# Patient Record
Sex: Female | Born: 1935 | Race: White | Hispanic: No | Smoking: Former smoker
Health system: Southern US, Community
[De-identification: ages and names within clinical notes are randomized; demographics above are authoritative.]

## PROBLEM LIST (undated history)

## (undated) DIAGNOSIS — R413 Other amnesia: Secondary | ICD-10-CM

## (undated) DIAGNOSIS — E049 Nontoxic goiter, unspecified: Secondary | ICD-10-CM

## (undated) DIAGNOSIS — I10 Essential (primary) hypertension: Secondary | ICD-10-CM

## (undated) DIAGNOSIS — R42 Dizziness and giddiness: Secondary | ICD-10-CM

## (undated) DIAGNOSIS — E785 Hyperlipidemia, unspecified: Secondary | ICD-10-CM

## (undated) DIAGNOSIS — M159 Polyosteoarthritis, unspecified: Secondary | ICD-10-CM

## (undated) DIAGNOSIS — C4490 Unspecified malignant neoplasm of skin, unspecified: Secondary | ICD-10-CM

## (undated) DIAGNOSIS — I639 Cerebral infarction, unspecified: Secondary | ICD-10-CM

## (undated) HISTORY — PX: BREAST SURGERY: SHX581

## (undated) HISTORY — DX: Polyosteoarthritis, unspecified: M15.9

## (undated) HISTORY — PX: KNEE SURGERY: SHX244

## (undated) HISTORY — DX: Cerebral infarction, unspecified: I63.9

## (undated) HISTORY — DX: Other amnesia: R41.3

## (undated) HISTORY — DX: Nontoxic goiter, unspecified: E04.9

## (undated) HISTORY — DX: Dizziness and giddiness: R42

## (undated) HISTORY — DX: Essential (primary) hypertension: I10

## (undated) HISTORY — DX: Hyperlipidemia, unspecified: E78.5

## (undated) HISTORY — DX: Unspecified malignant neoplasm of skin, unspecified: C44.90

---

## 1997-07-19 ENCOUNTER — Encounter: Admission: RE | Admit: 1997-07-19 | Discharge: 1997-10-17 | Payer: Self-pay | Admitting: Orthopedic Surgery

## 1999-08-01 ENCOUNTER — Encounter: Admission: RE | Admit: 1999-08-01 | Discharge: 1999-08-01 | Payer: Self-pay | Admitting: Family Medicine

## 1999-08-01 ENCOUNTER — Encounter: Payer: Self-pay | Admitting: Family Medicine

## 2000-08-16 ENCOUNTER — Ambulatory Visit (HOSPITAL_COMMUNITY): Admission: RE | Admit: 2000-08-16 | Discharge: 2000-08-16 | Payer: Self-pay | Admitting: Obstetrics and Gynecology

## 2000-08-16 ENCOUNTER — Encounter: Payer: Self-pay | Admitting: Obstetrics and Gynecology

## 2001-08-17 ENCOUNTER — Encounter: Payer: Self-pay | Admitting: Obstetrics and Gynecology

## 2001-08-17 ENCOUNTER — Ambulatory Visit (HOSPITAL_COMMUNITY): Admission: RE | Admit: 2001-08-17 | Discharge: 2001-08-17 | Payer: Self-pay | Admitting: Obstetrics and Gynecology

## 2002-08-24 ENCOUNTER — Ambulatory Visit (HOSPITAL_COMMUNITY): Admission: RE | Admit: 2002-08-24 | Discharge: 2002-08-24 | Payer: Self-pay | Admitting: Obstetrics and Gynecology

## 2002-08-24 ENCOUNTER — Encounter: Payer: Self-pay | Admitting: Obstetrics and Gynecology

## 2003-09-06 ENCOUNTER — Ambulatory Visit (HOSPITAL_COMMUNITY): Admission: RE | Admit: 2003-09-06 | Discharge: 2003-09-06 | Payer: Self-pay | Admitting: Family Medicine

## 2003-11-02 ENCOUNTER — Ambulatory Visit (HOSPITAL_COMMUNITY): Admission: RE | Admit: 2003-11-02 | Discharge: 2003-11-02 | Payer: Self-pay | Admitting: Orthopedic Surgery

## 2004-09-10 ENCOUNTER — Ambulatory Visit (HOSPITAL_COMMUNITY): Admission: RE | Admit: 2004-09-10 | Discharge: 2004-09-10 | Payer: Self-pay | Admitting: Family Medicine

## 2005-09-11 ENCOUNTER — Ambulatory Visit (HOSPITAL_COMMUNITY): Admission: RE | Admit: 2005-09-11 | Discharge: 2005-09-11 | Payer: Self-pay | Admitting: Family Medicine

## 2006-09-14 ENCOUNTER — Ambulatory Visit (HOSPITAL_COMMUNITY): Admission: RE | Admit: 2006-09-14 | Discharge: 2006-09-14 | Payer: Self-pay | Admitting: Family Medicine

## 2007-09-16 ENCOUNTER — Ambulatory Visit (HOSPITAL_COMMUNITY): Admission: RE | Admit: 2007-09-16 | Discharge: 2007-09-16 | Payer: Self-pay | Admitting: Internal Medicine

## 2008-09-17 ENCOUNTER — Ambulatory Visit (HOSPITAL_COMMUNITY): Admission: RE | Admit: 2008-09-17 | Discharge: 2008-09-17 | Payer: Self-pay | Admitting: Internal Medicine

## 2008-11-27 ENCOUNTER — Encounter: Admission: RE | Admit: 2008-11-27 | Discharge: 2008-11-27 | Payer: Self-pay | Admitting: Internal Medicine

## 2009-02-26 ENCOUNTER — Emergency Department (HOSPITAL_COMMUNITY): Admission: EM | Admit: 2009-02-26 | Discharge: 2009-02-26 | Payer: Self-pay | Admitting: Emergency Medicine

## 2009-03-01 ENCOUNTER — Emergency Department (HOSPITAL_COMMUNITY): Admission: EM | Admit: 2009-03-01 | Discharge: 2009-03-01 | Payer: Self-pay | Admitting: Emergency Medicine

## 2009-03-05 ENCOUNTER — Emergency Department (HOSPITAL_COMMUNITY): Admission: EM | Admit: 2009-03-05 | Discharge: 2009-03-05 | Payer: Self-pay | Admitting: Emergency Medicine

## 2009-03-12 ENCOUNTER — Emergency Department (HOSPITAL_COMMUNITY): Admission: EM | Admit: 2009-03-12 | Discharge: 2009-03-12 | Payer: Self-pay | Admitting: Family Medicine

## 2009-03-21 ENCOUNTER — Encounter: Admission: RE | Admit: 2009-03-21 | Discharge: 2009-04-24 | Payer: Self-pay | Admitting: Internal Medicine

## 2009-09-18 ENCOUNTER — Ambulatory Visit (HOSPITAL_COMMUNITY): Admission: RE | Admit: 2009-09-18 | Discharge: 2009-09-18 | Payer: Self-pay | Admitting: Internal Medicine

## 2010-03-25 ENCOUNTER — Ambulatory Visit: Payer: Self-pay | Admitting: Internal Medicine

## 2010-03-30 ENCOUNTER — Encounter: Payer: Self-pay | Admitting: Internal Medicine

## 2010-04-23 ENCOUNTER — Ambulatory Visit: Payer: MEDICARE | Admitting: Internal Medicine

## 2010-04-23 DIAGNOSIS — R413 Other amnesia: Secondary | ICD-10-CM

## 2010-04-23 DIAGNOSIS — E785 Hyperlipidemia, unspecified: Secondary | ICD-10-CM

## 2010-04-23 DIAGNOSIS — F4323 Adjustment disorder with mixed anxiety and depressed mood: Secondary | ICD-10-CM

## 2010-06-08 DIAGNOSIS — E049 Nontoxic goiter, unspecified: Secondary | ICD-10-CM

## 2010-06-08 HISTORY — DX: Nontoxic goiter, unspecified: E04.9

## 2010-06-17 DIAGNOSIS — E785 Hyperlipidemia, unspecified: Secondary | ICD-10-CM

## 2010-06-17 DIAGNOSIS — I10 Essential (primary) hypertension: Secondary | ICD-10-CM

## 2010-06-18 ENCOUNTER — Other Ambulatory Visit: Payer: Self-pay | Admitting: Internal Medicine

## 2010-06-18 ENCOUNTER — Ambulatory Visit: Payer: 59 | Admitting: Internal Medicine

## 2010-06-18 ENCOUNTER — Ambulatory Visit (INDEPENDENT_AMBULATORY_CARE_PROVIDER_SITE_OTHER)
Admission: RE | Admit: 2010-06-18 | Discharge: 2010-06-18 | Disposition: A | Discharge status: Home / Self Care | Payer: Medicare Other | Source: Ambulatory Visit | Attending: Internal Medicine | Admitting: Internal Medicine

## 2010-06-18 ENCOUNTER — Ambulatory Visit (HOSPITAL_BASED_OUTPATIENT_CLINIC_OR_DEPARTMENT_OTHER)
Admission: RE | Admit: 2010-06-18 | Discharge: 2010-06-18 | Disposition: A | Discharge status: Home / Self Care | Payer: Medicare Other | Source: Ambulatory Visit | Attending: Internal Medicine | Admitting: Internal Medicine

## 2010-06-18 DIAGNOSIS — E010 Iodine-deficiency related diffuse (endemic) goiter: Secondary | ICD-10-CM

## 2010-06-18 DIAGNOSIS — E049 Nontoxic goiter, unspecified: Secondary | ICD-10-CM | POA: Insufficient documentation

## 2010-07-10 ENCOUNTER — Ambulatory Visit (INDEPENDENT_AMBULATORY_CARE_PROVIDER_SITE_OTHER): Payer: MEDICARE | Admitting: Internal Medicine

## 2010-07-10 DIAGNOSIS — E049 Nontoxic goiter, unspecified: Secondary | ICD-10-CM

## 2010-07-10 DIAGNOSIS — I10 Essential (primary) hypertension: Secondary | ICD-10-CM

## 2010-07-10 DIAGNOSIS — E785 Hyperlipidemia, unspecified: Secondary | ICD-10-CM

## 2010-07-21 ENCOUNTER — Other Ambulatory Visit: Payer: Self-pay | Admitting: Internal Medicine

## 2010-07-21 ENCOUNTER — Encounter: Payer: Self-pay | Admitting: Internal Medicine

## 2010-07-21 DIAGNOSIS — E041 Nontoxic single thyroid nodule: Secondary | ICD-10-CM

## 2010-07-23 ENCOUNTER — Other Ambulatory Visit: Payer: Self-pay | Admitting: Interventional Radiology

## 2010-07-23 ENCOUNTER — Ambulatory Visit
Admission: RE | Admit: 2010-07-23 | Discharge: 2010-07-23 | Disposition: A | Discharge status: Home / Self Care | Payer: MEDICARE | Source: Ambulatory Visit | Attending: Internal Medicine | Admitting: Internal Medicine

## 2010-07-23 ENCOUNTER — Other Ambulatory Visit (HOSPITAL_COMMUNITY)
Admission: RE | Admit: 2010-07-23 | Discharge: 2010-07-23 | Disposition: A | Discharge status: Home / Self Care | Payer: MEDICARE | Source: Ambulatory Visit | Attending: Interventional Radiology | Admitting: Interventional Radiology

## 2010-07-23 DIAGNOSIS — E041 Nontoxic single thyroid nodule: Secondary | ICD-10-CM

## 2010-07-23 DIAGNOSIS — E049 Nontoxic goiter, unspecified: Secondary | ICD-10-CM | POA: Insufficient documentation

## 2010-07-25 NOTE — Op Note (Signed)
NAME:  Brandy Odonnell, Brandy Odonnell                       ACCOUNT NO.:  [id number]   MEDICAL RECORD NO.:  [id number]                   PATIENT TYPE:  AMB   LOCATION:  DAY                                  FACILITY:  [facility]   PHYSICIAN:  Madlyn Frankel. Charlann Boxer, M.D.               DATE OF BIRTH:  1926/02/21   DATE OF PROCEDURE:  11/02/2003  DATE OF DISCHARGE:                                 OPERATIVE REPORT   PREOPERATIVE DIAGNOSES:  1. Right knee suspected medial meniscal tear.  2. Lateral meniscal tear and patellofemoral chondromalacia per MRI.   POSTOPERATIVE DIAGNOSES/FINDINGS:  1. 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  2. Degenerative ___________ tearing with cleavage component to the medial     meniscus, posterior horn, mid body junction.  3. Grade 1-2 changes to the medial femoral condyle.  4. Degenerative cleavage-type component involving the extent of the anterior     horn to the mid body of the lateral meniscus.   PROCEDURE:  Diagnostic and operative arthroscopy with partial medial and  lateral meniscectomies, patellofemoral chondroplasty, minor chondroplasty  involving the medial femoral condyle.   SURGEON:  Madlyn Frankel. Charlann Boxer, M.D.   ASSISTANT:  None.   ANESTHESIA:  LMA.   INDICATIONS FOR PROCEDURE:  Brandy Odonnell is a pleasant 77 year old female who  is followed in the clinic for right knee following a minor injury.  On  examination in the office, she was initially noted to have some medial joint  line tenderness as well as what appeared to be some patellar tendonitis.  On  further observation and time, then developed some pain in the lateral side  of her joint as well.  Based on the persistence of her symptoms, despite  conservative measures, including a cortisone injection and return of her  symptoms, an MRI was ordered.  The MRI revealed the lateral meniscal tear,  correlating with anterolateral tenderness on exam as well as degenerative  change to the medial meniscus as well as chondromalacia.  After reviewing  risks and benefits and further conservative measures versus arthroscopy, she  consents for diagnostic and upper arthroscopy.   PROCEDURE IN DETAIL:  Patient was brought to the operating theater.  Once  adequate anesthesia and preoperative antibiotics were administered, the  patient was positioned supine with the right leg in a leg holder.  The right  lower extremity was then prepped and draped in a sterile fashion.  An  standard inferior lateral, superior lateral, inferior medial portals were  utilized.  Diagnostic evaluation of the knee was carried out, revealing the  above-noted findings.  Initially, probe evaluation of the medial meniscus  was carried out through the inferior medial portal.  There were noted  significantly degenerative changes in the medial meniscus.  For this reason,  a biting basket was used to debride banks and portions of this degenerative  change in the  posterior horn of the mid body junction.  Biting basket  debridement indicated and revealed that it was a cleavage-type component.  Approximately 20-25% of the meniscus in this region was debrided in order to  get it back to a stable level to prevent potential occurrence.  Some minor  debridement of the medial femoral condyle was carried out in order for  exposure but not significant.  Examination of the lateral compartment  revealed the significant degenerative cleavage tear in the lateral meniscus,  anterior horn, mid body.  The probe was utilized to determine this.  A 3.5  shaver was used for initial debridement for further delineation of the  fracture, followed by the biting basket to debride the inferior leaf.  The  deep fascial area was used to further debride this area.  The 3.5 shaver was  also used to debride the unstable fragments and fibrillating cartilage on  the apex of the patellofemoral compartment.  Once this was carried out,  the  knee was suctioned, drained, and irrigated with a 3.5 shaver.  The  instrumentation was removed.  Portals were reapproximated using 3-0 nylon.  The knee was then dressed in a bulky sterile dressing.  Patient tolerated  the procedure without complications and was transferred to the recovery  room.   POSTOPERATIVE PLAN:  Patient will return to the clinic in 14 days for suture  removal.  She will be weightbearing as tolerated.  The wounds should be kept  dry for at least a week.  No pool activity until sutures out.                                               Madlyn Frankel Charlann Boxer, M.D.    MDO/MEDQ  D:  11/02/2003  T:  11/02/2003  Job:  [id number]

## 2010-08-10 ENCOUNTER — Emergency Department (HOSPITAL_BASED_OUTPATIENT_CLINIC_OR_DEPARTMENT_OTHER)
Admission: EM | Admit: 2010-08-10 | Discharge: 2010-08-10 | Disposition: A | Discharge status: Home / Self Care | Payer: Medicare Other | Attending: Emergency Medicine | Admitting: Emergency Medicine

## 2010-08-10 DIAGNOSIS — E119 Type 2 diabetes mellitus without complications: Secondary | ICD-10-CM | POA: Insufficient documentation

## 2010-08-10 DIAGNOSIS — W57XXXA Bitten or stung by nonvenomous insect and other nonvenomous arthropods, initial encounter: Secondary | ICD-10-CM | POA: Insufficient documentation

## 2010-08-10 DIAGNOSIS — S30860A Insect bite (nonvenomous) of lower back and pelvis, initial encounter: Secondary | ICD-10-CM | POA: Insufficient documentation

## 2010-08-19 ENCOUNTER — Other Ambulatory Visit: Payer: Self-pay | Admitting: Internal Medicine

## 2010-08-19 DIAGNOSIS — Z139 Encounter for screening, unspecified: Secondary | ICD-10-CM

## 2010-08-19 DIAGNOSIS — Z1231 Encounter for screening mammogram for malignant neoplasm of breast: Secondary | ICD-10-CM

## 2010-09-09 ENCOUNTER — Other Ambulatory Visit: Payer: Self-pay | Admitting: Internal Medicine

## 2010-09-09 DIAGNOSIS — Z1231 Encounter for screening mammogram for malignant neoplasm of breast: Secondary | ICD-10-CM

## 2010-09-23 ENCOUNTER — Ambulatory Visit (HOSPITAL_COMMUNITY)
Admission: RE | Admit: 2010-09-23 | Discharge: 2010-09-23 | Disposition: A | Discharge status: Home / Self Care | Payer: Medicare Other | Source: Ambulatory Visit | Attending: Internal Medicine | Admitting: Internal Medicine

## 2010-09-23 ENCOUNTER — Ambulatory Visit (HOSPITAL_BASED_OUTPATIENT_CLINIC_OR_DEPARTMENT_OTHER): Payer: Medicare Other

## 2010-09-23 DIAGNOSIS — Z1231 Encounter for screening mammogram for malignant neoplasm of breast: Secondary | ICD-10-CM | POA: Insufficient documentation

## 2011-02-08 IMAGING — CT CT HEAD WO/W CM
1 of 2 series · 13 of 30 positions shown, 17 images · IV contrast (75CC OMNI 300)
Comparison: None

CLINICAL DATA: Dizziness and ataxia.

CT HEAD WITHOUT AND WITH CONTRAST
TECHNIQUE: Contiguous axial images were obtained from the base of
the skull through the vertex without and with intravenous contrast.
Contrast: 75 ml Qmnipaque-W11 IV.

[Series 32: 3d filtered head · axial · 0.49mm/px · z∈[+0,+122]mm · 13 of 28 slices shown, 17 images]
[im 2/28  brain]
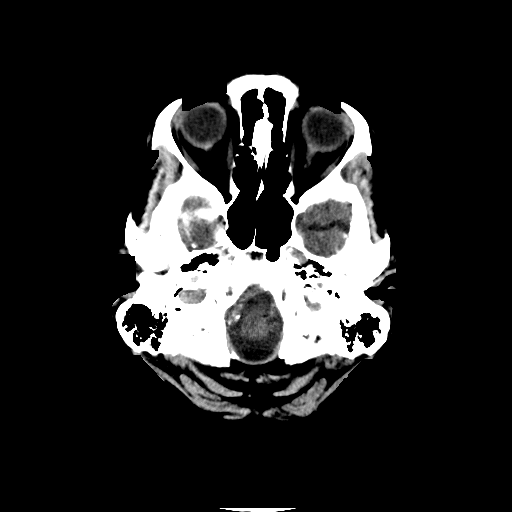
[im 2/28  bone]
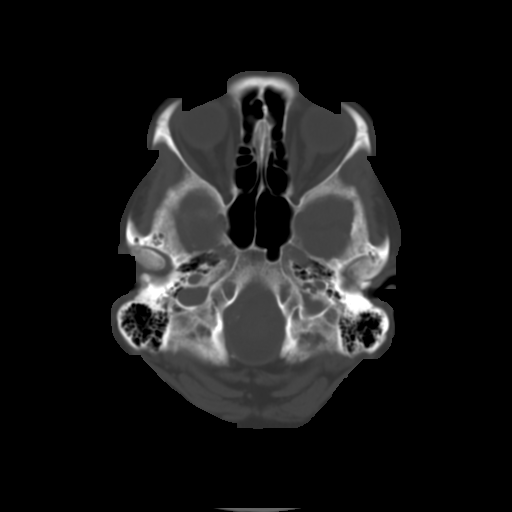
[im 4/28  brain]
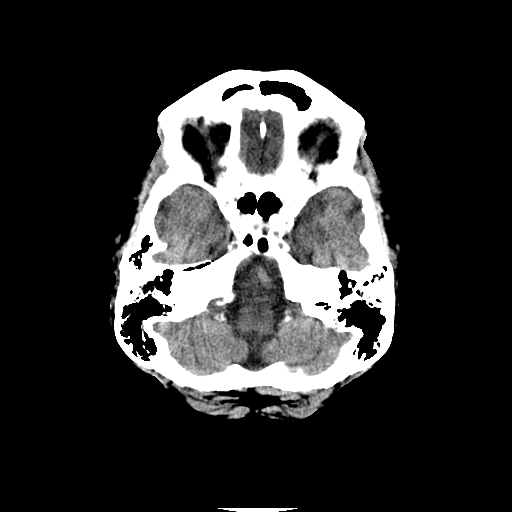
[im 6/28  brain]
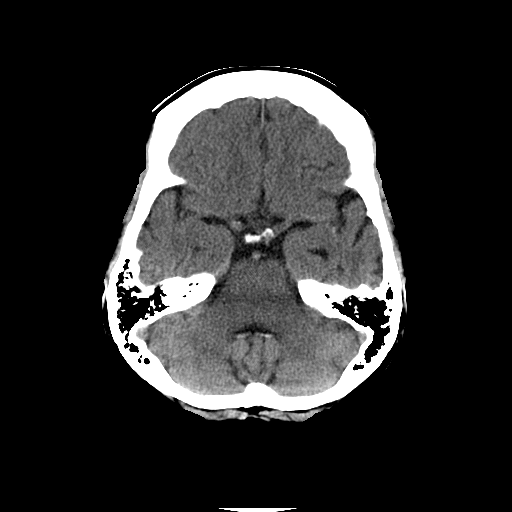
[im 8/28  brain]
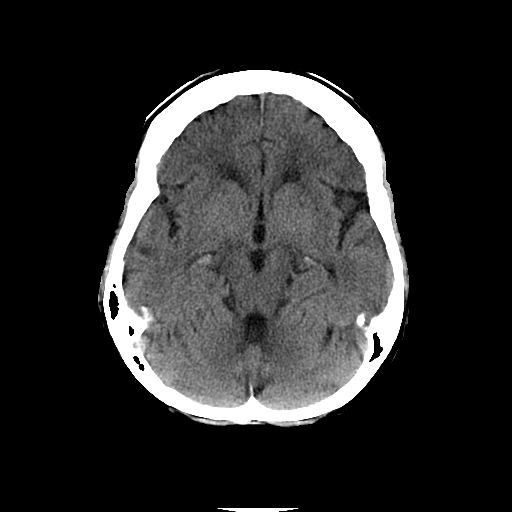
[im 10/28  brain]
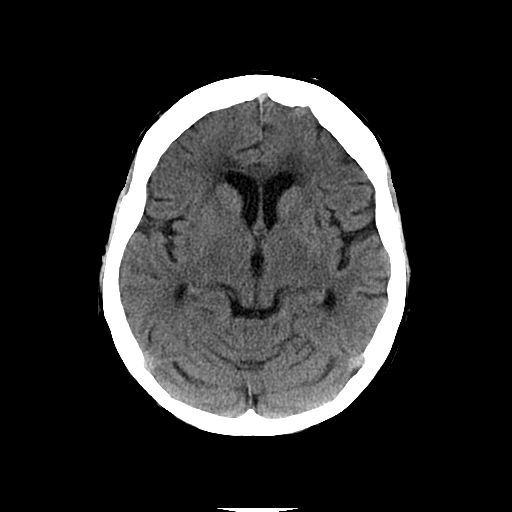
[im 10/28  bone]
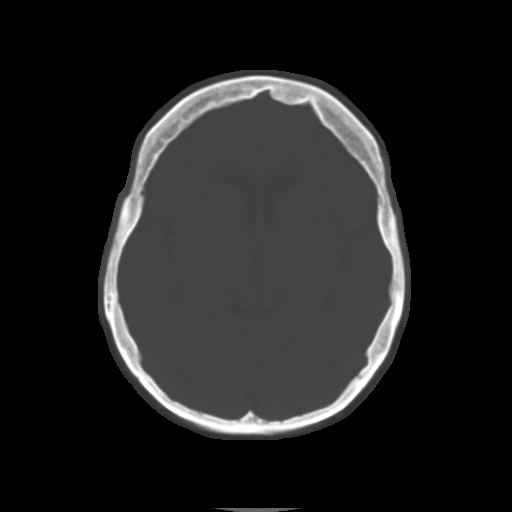
[im 12/28  brain]
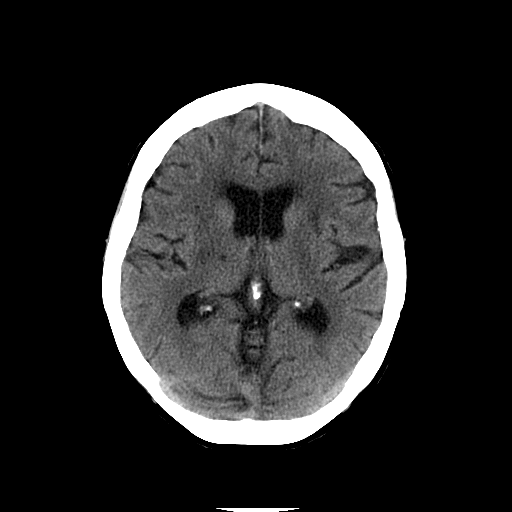
[im 14/28  brain]
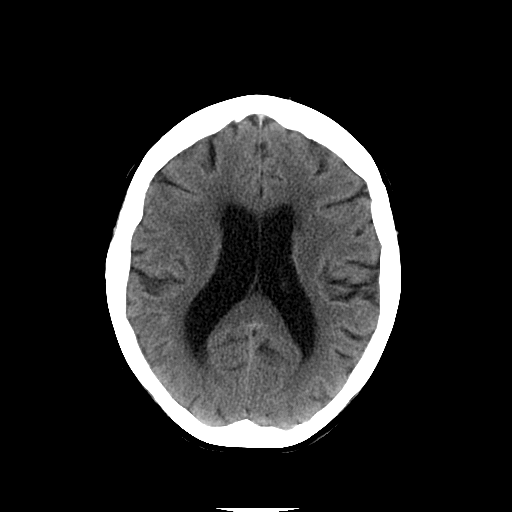
[im 16/28  brain]
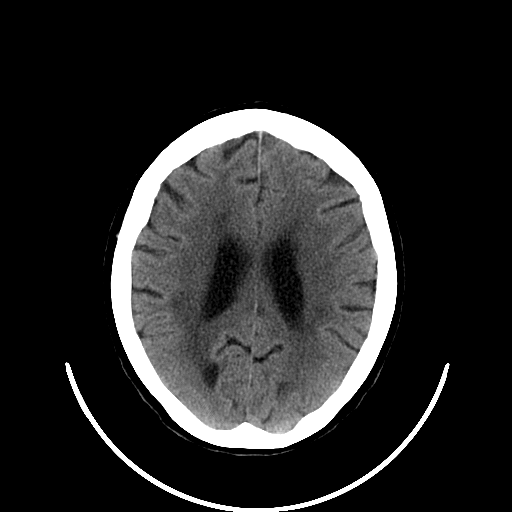
[im 18/28  brain]
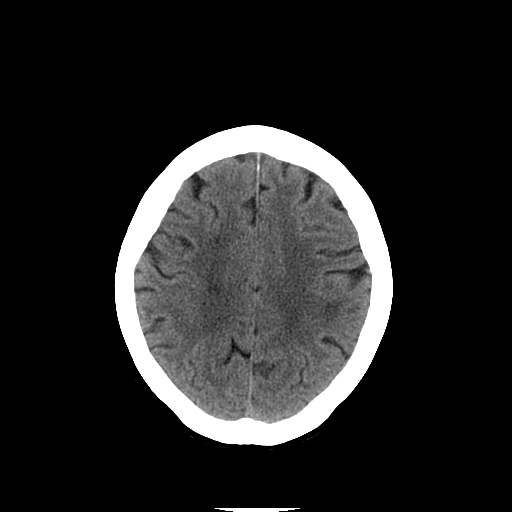
[im 18/28  bone]
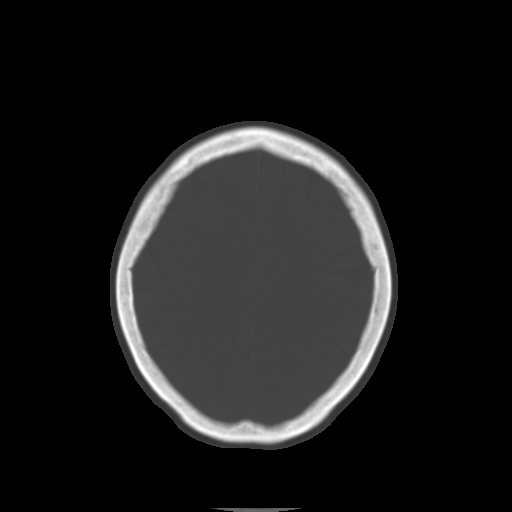
[im 20/28  brain]
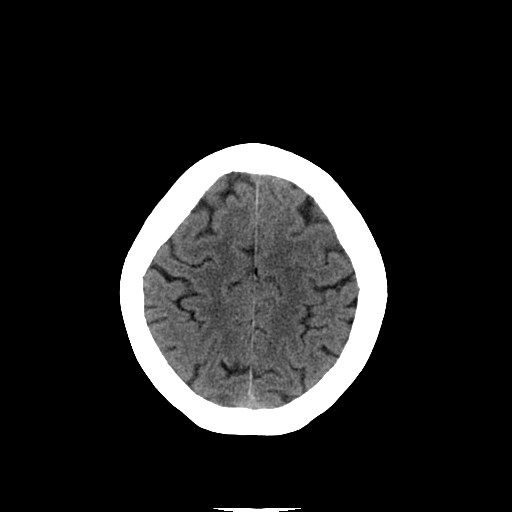
[im 22/28  brain]
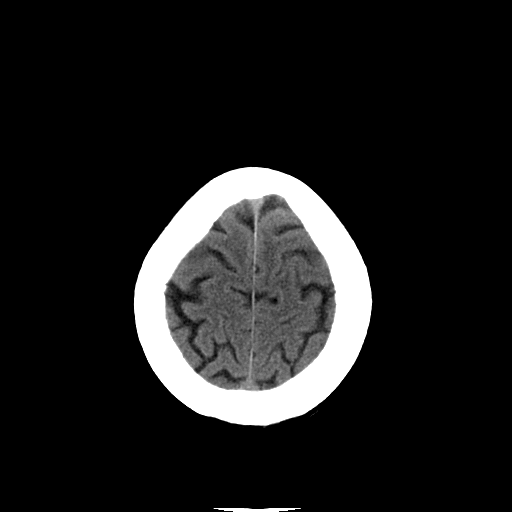
[im 24/28  brain]
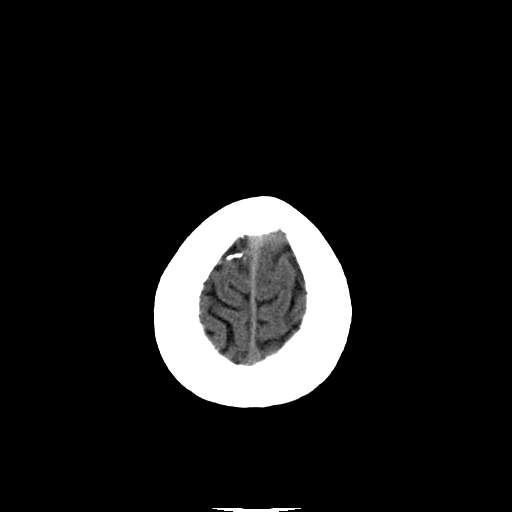
[im 26/28  brain]
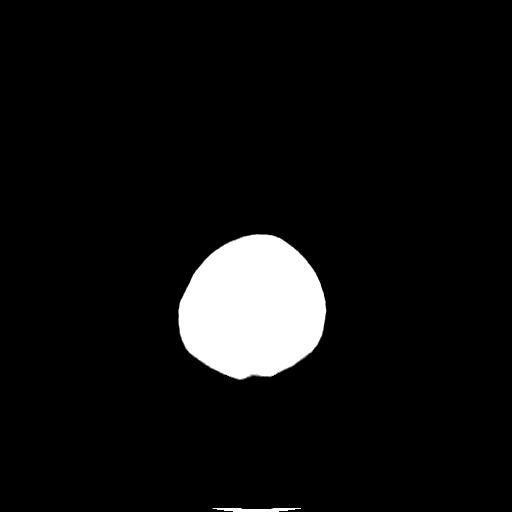
[im 26/28  bone]
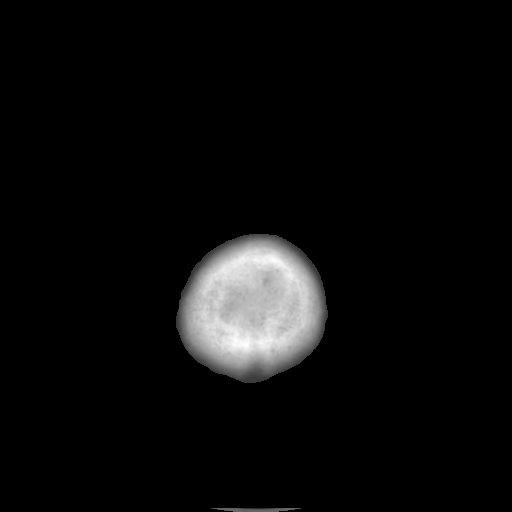

[13 of 30 positions shown; findings below may reference images not displayed]

FINDINGS: Ventricle size is normal.  Chronic ischemic changes are
present in the white matter bilaterally.  There is a small chronic
infarct in the internal capsule on the right and in the external
capsule on the left.  No acute infarct is seen.  There is no
hemorrhage or mass.  Following contrast infusion, there is a normal
enhancement pattern.  No focal skull abnormality.
IMPRESSION: Chronic microvascular ischemia.  No acute intracranial abnormality.

## 2011-02-11 ENCOUNTER — Encounter: Payer: Self-pay | Admitting: Internal Medicine

## 2011-02-11 ENCOUNTER — Ambulatory Visit (INDEPENDENT_AMBULATORY_CARE_PROVIDER_SITE_OTHER): Payer: Medicare Other | Admitting: Internal Medicine

## 2011-02-11 DIAGNOSIS — Z23 Encounter for immunization: Secondary | ICD-10-CM

## 2011-02-11 DIAGNOSIS — I639 Cerebral infarction, unspecified: Secondary | ICD-10-CM | POA: Insufficient documentation

## 2011-02-11 DIAGNOSIS — E049 Nontoxic goiter, unspecified: Secondary | ICD-10-CM | POA: Insufficient documentation

## 2011-02-11 DIAGNOSIS — I10 Essential (primary) hypertension: Secondary | ICD-10-CM

## 2011-02-11 DIAGNOSIS — R42 Dizziness and giddiness: Secondary | ICD-10-CM

## 2011-02-11 DIAGNOSIS — R413 Other amnesia: Secondary | ICD-10-CM

## 2011-02-11 DIAGNOSIS — E785 Hyperlipidemia, unspecified: Secondary | ICD-10-CM

## 2011-02-11 DIAGNOSIS — I6350 Cerebral infarction due to unspecified occlusion or stenosis of unspecified cerebral artery: Secondary | ICD-10-CM

## 2011-02-11 DIAGNOSIS — M159 Polyosteoarthritis, unspecified: Secondary | ICD-10-CM | POA: Insufficient documentation

## 2011-02-11 LAB — LIPID PANEL
HDL: 57 mg/dL (ref >39)
LDL Cholesterol: 120 mg/dL — ABNORMAL HIGH (ref 0–99)
Total CHOL/HDL Ratio: 3.7 Ratio
Triglycerides: 176 mg/dL — ABNORMAL HIGH (ref <150)
VLDL: 35 mg/dL (ref 0–40)

## 2011-02-11 LAB — COMPREHENSIVE METABOLIC PANEL
Albumin: 4.1 g/dL (ref 3.5–5.2)
Alkaline Phosphatase: 79 U/L (ref 39–117)
Calcium: 9.1 mg/dL (ref 8.4–10.5)
Chloride: 108 mEq/L (ref 96–112)
Glucose, Bld: 93 mg/dL (ref 70–99)
Potassium: 4.1 mEq/L (ref 3.5–5.3)

## 2011-02-11 MED ORDER — LISINOPRIL 10 MG PO TABS: 10.0000 mg | ORAL_TABLET | Freq: Every day | ORAL | Status: DC

## 2011-02-11 MED ORDER — AMLODIPINE BESYLATE 10 MG PO TABS: 10.0000 mg | ORAL_TABLET | Freq: Every day | ORAL | Status: DC

## 2011-02-11 NOTE — Progress Notes (Signed)
Subjective:    Patient ID: Brandy Odonnell, female    DOB: [birth year], 77 y.o.   MRN: [id number]  HPI  Brandy Odonnell is here for follow up of HTN and hyperlipidemia.  Overall doing well since loss of husband earlier this year.  Spent Thanksgiving doing an antique show.  Daughter in [place] helps some.  She  Does not drive at night.  Managing well per her report.  She does drink one or two glasses of wine per night. Two weeks ago she fell and landed on L hip. She reported she had a bruise that has since healed.  She did not hit head and no LOC.  She reports she tripped on curb.  No palpitations no chest pain no dizziness prior to fall.  She can walk pain free on L hip now  I note she had not had a CPE despite my counsel that she needs one.  She has not had a flu vaccine  Allergies  Allergen Reactions  . Latex Swelling    Throat swelling per pt  . Penicillins    Past Medical History  Diagnosis Date  . Hypertension   . Stroke   . Generalized osteoarthrosis, unspecified site   . Dizziness     chronic  . Memory impairment   . Hyperlipidemia   . Goiter 4/12    multinodular  . Diabetes mellitus     Hx of gestational   Past Surgical History  Procedure Date  . Knee surgery    History   Social History  . Marital Status: Widowed    Spouse Name: Brandy Odonnell    Number of Children: 1  . Years of Education: HS   Occupational History  . [employer]    Social History Main Topics  . Smoking status: Former Smoker    Quit date: 03/10/1955  . Smokeless tobacco: Never Used  . Alcohol Use: 8.4 oz/week    14 Glasses of wine per week  . Drug Use: No  . Sexually Active: Not Currently -- Female partner(s)   Other Topics Concern  . Not on file   Social History Narrative  . No narrative on file   Family History  Problem Relation Age of Onset  . Stroke Father   . Heart disease Father    There is no problem list on file for this patient.  Current Outpatient Prescriptions on File Prior to  Visit  Medication Sig Dispense Refill  . amLODipine (NORVASC) 10 MG tablet Take 10 mg by mouth daily.        Marland Kitchen aspirin 81 MG tablet Take 81 mg by mouth daily.        Marland Kitchen lisinopril (PRINIVIL,ZESTRIL) 10 MG tablet Take 10 mg by mouth daily.            Review of Systems See above    Objective:   Physical Exam  Physical Exam  Nursing note and vitals reviewed.  Constitutional: She is oriented to person, place, and time. She appears well-developed and well-nourished.  HENT:  Head: Normocephalic and atraumatic.  Cardiovascular: Normal rate and regular rhythm. Exam reveals no gallop and no friction rub.  No murmur heard.  Pulmonary/Chest: Breath sounds normal. She has no wheezes. She has no rales.  Neurological: She is alert and oriented to person, place, and time.  Skin: Skin is warm and dry.  Psychiatric: She has a normal mood and affect. Her behavior is normal. M/S  L hip:  No ecchymosis.  Good full ROM  That she does not report pain       Assessment & Plan:  1)  HTN  Well controlled  Will refill RX for 6 months.  Check chemistries today 2)  Hyperlipidemia:  Will check today 3)  Health Maintenance:  Will give flu vaccine today  Counseled To schedule CPE

## 2011-02-11 NOTE — Patient Instructions (Signed)
Labs will be mailed to you  Schedule complete physical  exam in January  Flu shot given today

## 2011-02-12 ENCOUNTER — Encounter: Payer: Self-pay | Admitting: Emergency Medicine

## 2011-04-14 ENCOUNTER — Ambulatory Visit: Payer: Medicare Other | Admitting: Internal Medicine

## 2011-04-30 ENCOUNTER — Telehealth: Payer: Self-pay | Admitting: Emergency Medicine

## 2011-04-30 DIAGNOSIS — I10 Essential (primary) hypertension: Secondary | ICD-10-CM

## 2011-04-30 DIAGNOSIS — E785 Hyperlipidemia, unspecified: Secondary | ICD-10-CM

## 2011-04-30 NOTE — Telephone Encounter (Signed)
Spoke with pt.  She rescheduled her CPE for afternoon appointment.  She would like to get labs done before CPE appt.  Aware I will send orders upstairs and she can come one morning when she has had nothing to eat/drink for labs.  She is agreeable.

## 2011-05-04 ENCOUNTER — Encounter: Payer: Medicare Other | Admitting: Internal Medicine

## 2011-05-16 LAB — CBC WITH DIFFERENTIAL/PLATELET
Basophils Relative: 0 % (ref 0–1)
Eosinophils Relative: 4 % (ref 0–5)
HCT: 46.8 % — ABNORMAL HIGH (ref 36.0–46.0)
Hemoglobin: 14.8 g/dL (ref 12.0–15.0)
MCH: 29 pg (ref 26.0–34.0)
MCHC: 31.6 g/dL (ref 30.0–36.0)
Monocytes Absolute: 0.4 10*3/uL (ref 0.1–1.0)
Monocytes Relative: 6 % (ref 3–12)
Neutro Abs: 4.4 10*3/uL (ref 1.7–7.7)
Neutrophils Relative %: 58 % (ref 43–77)
RDW: 13.3 % (ref 11.5–15.5)

## 2011-05-16 LAB — LIPID PANEL
Cholesterol: 232 mg/dL — ABNORMAL HIGH (ref 0–200)
LDL Cholesterol: 144 mg/dL — ABNORMAL HIGH (ref 0–99)
VLDL: 25 mg/dL (ref 0–40)

## 2011-05-16 LAB — COMPREHENSIVE METABOLIC PANEL
ALT: 17 U/L (ref 0–35)
Albumin: 4.2 g/dL (ref 3.5–5.2)
Alkaline Phosphatase: 81 U/L (ref 39–117)
Total Protein: 6.9 g/dL (ref 6.0–8.3)

## 2011-05-16 LAB — TSH: TSH: 1.757 u[IU]/mL (ref 0.350–4.500)

## 2011-05-18 ENCOUNTER — Encounter: Payer: Self-pay | Admitting: Emergency Medicine

## 2011-06-03 ENCOUNTER — Ambulatory Visit (INDEPENDENT_AMBULATORY_CARE_PROVIDER_SITE_OTHER): Payer: Medicare Other | Admitting: Internal Medicine

## 2011-06-03 ENCOUNTER — Encounter: Payer: Self-pay | Admitting: Internal Medicine

## 2011-06-03 ENCOUNTER — Encounter: Payer: Self-pay | Admitting: Gastroenterology

## 2011-06-03 VITALS — BP 130/60 | HR 80 | Temp 97.0°F | Ht 61.75 in | Wt 140.1 lb

## 2011-06-03 DIAGNOSIS — D126 Benign neoplasm of colon, unspecified: Secondary | ICD-10-CM

## 2011-06-03 DIAGNOSIS — I10 Essential (primary) hypertension: Secondary | ICD-10-CM

## 2011-06-03 DIAGNOSIS — Z780 Asymptomatic menopausal state: Secondary | ICD-10-CM

## 2011-06-03 DIAGNOSIS — N951 Menopausal and female climacteric states: Secondary | ICD-10-CM

## 2011-06-03 DIAGNOSIS — K635 Polyp of colon: Secondary | ICD-10-CM

## 2011-06-03 DIAGNOSIS — E049 Nontoxic goiter, unspecified: Secondary | ICD-10-CM

## 2011-06-03 DIAGNOSIS — E785 Hyperlipidemia, unspecified: Secondary | ICD-10-CM

## 2011-06-03 LAB — POCT URINALYSIS DIPSTICK
Blood, UA: NEGATIVE
Glucose, UA: NEGATIVE
Ketones, UA: NEGATIVE
Leukocytes, UA: NEGATIVE
Spec Grav, UA: >=1.03
Urobilinogen, UA: NEGATIVE
pH, UA: 6.5

## 2011-06-03 MED ORDER — CLOTRIMAZOLE-BETAMETHASONE 1-0.05 % EX LOTN: 1.0000 "application " | TOPICAL_LOTION | Freq: Two times a day (BID) | CUTANEOUS | Status: DC

## 2011-06-03 NOTE — Progress Notes (Signed)
Subjective:    Patient ID: Brandy Odonnell, female    DOB: 1926-01-19, 78 y.o.   MRN: [id number]  HPI Brandy Odonnell is here for comprehensive eval.   Overall doing well,  This is anniversary of husband's death so sad on some days but not depressed daily.   No chest pain no SOB no Le edema  Tolerating meds fine.  Arthritis not bothering her.  She reports she has a history of colon polyps but cannot recall when her last colonoscopy was  Allergies  Allergen Reactions  . Latex Itching and Swelling    Throat swelling per pt  . Penicillins Swelling    Throat swelling   Past Medical History  Diagnosis Date  . Dizziness     chronic  . Memory impairment   . Generalized osteoarthrosis, unspecified site   . Diabetes mellitus     Hx of gestational  . Hyperlipidemia   . Hypertension   . Goiter 4/12    multinodular  . Stroke    Past Surgical History  Procedure Date  . Knee surgery    History   Social History  . Marital Status: Widowed    Spouse Name: N/A    Number of Children: 1  . Years of Education: HS   Occupational History  . [employer]    Social History Main Topics  . Smoking status: Former Smoker    Quit date: 03/10/1955  . Smokeless tobacco: Never Used  . Alcohol Use: 8.4 oz/week    14 Glasses of wine per week  . Drug Use: No  . Sexually Active: Not Currently -- Female partner(s)   Other Topics Concern  . Not on file   Social History Narrative  . No narrative on file   Family History  Problem Relation Age of Onset  . Stroke Father   . Heart disease Father    Patient Active Problem List  Diagnoses  . Generalized osteoarthrosis, unspecified site  . Hyperlipidemia  . Hypertension  . Stroke  . Goiter  . Dizziness  . Memory impairment   Current Outpatient Prescriptions on File Prior to Visit  Medication Sig Dispense Refill  . amLODipine (NORVASC) 10 MG tablet Take 1 tablet (10 mg total) by mouth daily.  90 tablet  1  . aspirin 81 MG tablet Take 81 mg  by mouth daily.        Marland Kitchen lisinopril (PRINIVIL,ZESTRIL) 10 MG tablet Take 1 tablet (10 mg total) by mouth daily.  90 tablet  1       Review of Systems  Constitutional: Negative.   HENT: Negative.   Respiratory: Negative.   Cardiovascular: Negative.   Gastrointestinal: Negative.   Genitourinary: Negative.   Skin: Negative.   Neurological: Negative.        Objective:   Physical Exam See below       Assessment & Plan:ekg  1  HTN  Well controlled    EKG today  Continue same meds  EKG today isolated q in lead III 2)  Multinodular goiter with neg biopsy  Will recheck U/S  3)  Mild Hyperlipidemia  DAsh diet given.  She does not wish meds.  4)  Menopause  Will get DEXA and schedule  5)  ???? H/o colon polyps  Will schedule with [facility] 6)  CVA  Counseled to take ASA daily       Physical Exam  Nursing note and vitals reviewed.  Constitutional: She is oriented to person, place, and time. She  appears well-developed and well-nourished.  HENT:  Head: Normocephalic and atraumatic.  Right Ear: Tympanic membrane and ear canal normal. No drainage. Tympanic membrane is not injected and not erythematous.  Left Ear: Tympanic membrane and ear canal normal. No drainage. Tympanic membrane is not injected and not erythematous.  Nose: Nose normal. Right sinus exhibits no maxillary sinus tenderness and no frontal sinus tenderness. Left sinus exhibits no maxillary sinus tenderness and no frontal sinus tenderness.  Mouth/Throat: Oropharynx is clear and moist. No oral lesions. No oropharyngeal exudate.  Eyes: Conjunctivae and EOM are normal. Pupils are equal, round, and reactive to light.  Neck: Normal range of motion. Neck supple. No JVD present. Carotid bruit is not present. No mass and no thyromegaly present.  Cardiovascular: Normal rate, regular rhythm, S1 normal, S2 normal and intact distal pulses. Exam reveals no gallop and no friction rub.  No murmur heard.  Pulses:  Carotid pulses  are 2+ on the right side, and 2+ on the left side.  Dorsalis pedis pulses are 2+ on the right side, and 2+ on the left side.  No carotid bruit. No LE edema  Pulmonary/Chest: Breath sounds normal. She has no wheezes. She has no rales. She exhibits no tenderness. Breasts no discrete masses no nipple discharge no axillary adenopathy bilaterally Abdominal: Soft. Bowel sounds are normal. She exhibits no distension and no mass. There is no hepatosplenomegaly. There is no tenderness. There is no CVA tenderness.  Rectal no mass guaiac neg Musculoskeletal: Normal range of motion.  No active synovitis to joints.  Lymphadenopathy:  She has no cervical adenopathy.  She has no axillary adenopathy.  Right: No inguinal and no supraclavicular adenopathy present.  Left: No inguinal and no supraclavicular adenopathy present.  Neurological: She is alert and oriented to person, place, and time. She has normal strength and normal reflexes. She displays no tremor. No cranial nerve deficit or sensory deficit. Coordination and gait normal.  Skin: Skin is warm and dry. No rash noted. No cyanosis. Nails show no clubbing.  Psychiatric: She has a normal mood and affect. Her speech is normal and behavior is normal. Cognition and memory are normal.    I spent 45 minutes with this pt

## 2011-06-09 ENCOUNTER — Other Ambulatory Visit: Payer: Medicare Other

## 2011-06-10 ENCOUNTER — Ambulatory Visit (HOSPITAL_BASED_OUTPATIENT_CLINIC_OR_DEPARTMENT_OTHER)
Admission: RE | Admit: 2011-06-10 | Discharge: 2011-06-10 | Disposition: A | Discharge status: Home / Self Care | Payer: Medicare Other | Source: Ambulatory Visit | Attending: Internal Medicine | Admitting: Internal Medicine

## 2011-06-10 ENCOUNTER — Ambulatory Visit: Payer: Self-pay | Attending: Internal Medicine | Admitting: Physical Therapy

## 2011-06-10 DIAGNOSIS — E049 Nontoxic goiter, unspecified: Secondary | ICD-10-CM

## 2011-06-10 DIAGNOSIS — E041 Nontoxic single thyroid nodule: Secondary | ICD-10-CM

## 2011-06-16 ENCOUNTER — Telehealth: Payer: Self-pay | Admitting: Internal Medicine

## 2011-06-16 NOTE — Telephone Encounter (Signed)
Call pt and let her know that her thyroid ultrasound shows small nodules very similar to one year ago.    Only thing we need to do is follow her thyroid  ultrasound once a year

## 2011-06-16 NOTE — Telephone Encounter (Signed)
Left message on answering machine for pt to return call to the office

## 2011-06-16 NOTE — Telephone Encounter (Signed)
Pt aware of results, agreeable to repeat u/s in one year

## 2011-06-17 ENCOUNTER — Telehealth: Payer: Self-pay | Admitting: Internal Medicine

## 2011-06-17 DIAGNOSIS — Z780 Asymptomatic menopausal state: Secondary | ICD-10-CM

## 2011-06-17 NOTE — Telephone Encounter (Signed)
Pt was scheduled the day of her appt for 06/09/11, but it appears she cancelled her appt.  Spoke with [facility].  She states the original appt date was not good, would like me to r/s for her.  Fridays are not good days, will call tomorrow to r/s.  Would like to know locations- would like other than [facility] if possible

## 2011-06-17 NOTE — Telephone Encounter (Signed)
Please call pt. And get her scheduled for a bone density test.  I ordered on 3/27 but I do not see in appointments.  Message back with appt time  Thanks.

## 2011-06-18 NOTE — Telephone Encounter (Signed)
Spoke with [name] at the [facility] with [facility].  Pt is scheduled for bone density 4/16 @ 130/check in at 115pm.  Address is [address], at the corner of [address] and [address].  Brandy Odonnell is aware and agreeable to appt date and time

## 2011-06-23 ENCOUNTER — Ambulatory Visit
Admission: RE | Admit: 2011-06-23 | Discharge: 2011-06-23 | Disposition: A | Discharge status: Home / Self Care | Payer: Medicare Other | Source: Ambulatory Visit | Attending: Internal Medicine | Admitting: Internal Medicine

## 2011-06-23 DIAGNOSIS — Z780 Asymptomatic menopausal state: Secondary | ICD-10-CM

## 2011-06-24 ENCOUNTER — Encounter: Payer: Self-pay | Admitting: Internal Medicine

## 2011-06-24 DIAGNOSIS — M8580 Other specified disorders of bone density and structure, unspecified site: Secondary | ICD-10-CM | POA: Insufficient documentation

## 2011-06-30 ENCOUNTER — Ambulatory Visit (INDEPENDENT_AMBULATORY_CARE_PROVIDER_SITE_OTHER): Payer: 59 | Admitting: Gastroenterology

## 2011-06-30 ENCOUNTER — Encounter: Payer: Self-pay | Admitting: Gastroenterology

## 2011-06-30 VITALS — BP 142/72 | HR 84 | Ht 63.0 in | Wt 142.0 lb

## 2011-06-30 DIAGNOSIS — Z1211 Encounter for screening for malignant neoplasm of colon: Secondary | ICD-10-CM

## 2011-06-30 NOTE — Progress Notes (Signed)
History of Present Illness: This is an 78 year old female referred for possible history of colon polyps. The patient relates that she had an unsedated sigmoidoscopy performed in [place] in the 1970s that was apparently normal. She does not recall ever having a colonoscopy and she does not recall a history of colon polyps. No family history of colon cancer or colon polyps. Denies weight loss, abdominal pain, constipation, diarrhea, change in stool caliber, melena, hematochezia, nausea, vomiting, dysphagia, reflux symptoms, chest pain.   Review of Systems: Pertinent positive and negative review of systems were noted in the above HPI section. All other review of systems were otherwise negative.  Current Medications, Allergies, Past Medical History, Past Surgical History, Family History and Social History were reviewed in [facility] record.  Physical Exam: General: Well developed , well nourished, no acute distress Head: Normocephalic and atraumatic Eyes:  sclerae anicteric, EOMI Ears: Normal auditory acuity Mouth: No deformity or lesions Neck: Supple, no masses or thyromegaly Lungs: Clear throughout to auscultation Heart: Regular rate and rhythm; no murmurs, rubs or bruits Abdomen: Soft, non tender and non distended. No masses, hepatosplenomegaly or hernias noted. Normal Bowel sounds Musculoskeletal: Symmetrical with no gross deformities  Skin: No lesions on visible extremities Pulses:  Normal pulses noted Extremities: No clubbing, cyanosis, edema or deformities noted Neurological: Alert oriented x 4, grossly nonfocal Cervical Nodes:  No significant cervical adenopathy Inguinal Nodes: No significant inguinal adenopathy Psychological:  Alert and cooperative. Normal mood and affect  Assessment and Recommendations:  1. Colorectal cancer screening, average risk. There is no history of colon polyps or a prior colonoscopy. She states she had a sigmoidoscopy in the 41s  in [place]. The current guidelines for colorectal cancer screening do not recommend screening colonoscopies for average risk patients after age 85. We discussed the guidelines and she is very comfortable deferring screening colonoscopy.

## 2011-06-30 NOTE — Patient Instructions (Signed)
cc: Raechel Chute, MD

## 2011-07-15 ENCOUNTER — Ambulatory Visit (INDEPENDENT_AMBULATORY_CARE_PROVIDER_SITE_OTHER): Payer: Medicare Other | Admitting: Internal Medicine

## 2011-07-15 ENCOUNTER — Encounter: Payer: Self-pay | Admitting: Internal Medicine

## 2011-07-15 VITALS — BP 138/72 | HR 77 | Temp 98.1°F | Ht 61.75 in | Wt 142.2 lb

## 2011-07-15 DIAGNOSIS — M8580 Other specified disorders of bone density and structure, unspecified site: Secondary | ICD-10-CM

## 2011-07-15 DIAGNOSIS — M899 Disorder of bone, unspecified: Secondary | ICD-10-CM

## 2011-07-15 DIAGNOSIS — I10 Essential (primary) hypertension: Secondary | ICD-10-CM

## 2011-07-15 DIAGNOSIS — E785 Hyperlipidemia, unspecified: Secondary | ICD-10-CM

## 2011-07-15 NOTE — Progress Notes (Signed)
Subjective:    Patient ID: Brandy Odonnell, female    DOB: [birth year], 78 y.o.   MRN: [id number]  HPI Brandy Odonnell is here for follow up.  See bone density  She has osteopenia and reports she is taking her calcium but not vitamin D  See lipids.   She does not wish to take a statin and would like to try diet control.  She reports she is not eating as well as she could because she does not cook much since her husband died  Allergies  Allergen Reactions  . Latex Itching and Swelling    Throat swelling per pt  . Penicillins Swelling    Throat swelling   Past Medical History  Diagnosis Date  . Dizziness     chronic  . Memory impairment   . Generalized osteoarthrosis, unspecified site   . Diabetes mellitus     Hx of gestational  . Hyperlipidemia   . Hypertension   . Goiter 4/12    multinodular  . Stroke   . Skin cancer    Past Surgical History  Procedure Date  . Knee surgery     right  . Breast surgery     right, tumor removed   History   Social History  . Marital Status: Widowed    Spouse Name: N/A    Number of Children: 1  . Years of Education: HS   Occupational History  . [employer]    Social History Main Topics  . Smoking status: Former Smoker    Quit date: 03/10/1955  . Smokeless tobacco: Never Used  . Alcohol Use: 8.4 oz/week    14 Glasses of wine per week     wine ocass  . Drug Use: No  . Sexually Active: Not Currently -- Female partner(s)   Other Topics Concern  . Not on file   Social History Narrative  . No narrative on file   Family History  Problem Relation Age of Onset  . Heart disease Father   . Colon cancer Paternal Grandfather    Patient Active Problem List  Diagnoses  . Generalized osteoarthrosis, unspecified site  . Hyperlipidemia  . Hypertension  . Stroke  . Goiter  . Dizziness  . Memory impairment  . Osteopenia   Current Outpatient Prescriptions on File Prior to Visit  Medication Sig Dispense Refill  . amLODipine (NORVASC)  10 MG tablet Take 1 tablet (10 mg total) by mouth daily.  90 tablet  1  . aspirin 81 MG tablet Take 81 mg by mouth daily.        Marland Kitchen CALCIUM CITRATE PO Take 1 tablet by mouth daily.      Marland Kitchen lisinopril (PRINIVIL,ZESTRIL) 10 MG tablet Take 1 tablet (10 mg total) by mouth daily.  90 tablet  1  . NIACIN PO Take 1 tablet by mouth as needed.           Review of Systems see HPI   Objective:   Physical Exam Physical Exam  Nursing note and vitals reviewed.  Constitutional: She is oriented to person, place, and time. She appears well-developed and well-nourished.  HENT:  Head: Normocephalic and atraumatic.  Cardiovascular: Normal rate and regular rhythm. Exam reveals no gallop and no friction rub.  No murmur heard.  Pulmonary/Chest: Breath sounds normal. She has no wheezes. She has no rales.  Neurological: She is alert and oriented to person, place, and time.  Skin: Skin is warm and dry.  Psychiatric: She has a normal mood  and affect. Her behavior is normal.       Assessment & Plan:  1)  Hperlipidemia  Gave copy of dash diet.  Will recheck fasting levels in 6 months to one year 2)  Osteopenia:  Advised to add vitmain D  400-800 units 3)  HTN:  Adequately controlled  See min 6 mlonths

## 2011-07-15 NOTE — Patient Instructions (Signed)
Follow Dash diet  See me 6 months

## 2011-09-30 ENCOUNTER — Ambulatory Visit (HOSPITAL_COMMUNITY): Admission: RE | Admit: 2011-09-30 | Payer: Medicare Other | Source: Ambulatory Visit

## 2011-10-01 ENCOUNTER — Other Ambulatory Visit: Payer: Self-pay | Admitting: Internal Medicine

## 2011-10-01 DIAGNOSIS — Z1231 Encounter for screening mammogram for malignant neoplasm of breast: Secondary | ICD-10-CM

## 2011-10-12 ENCOUNTER — Other Ambulatory Visit: Payer: Self-pay | Admitting: *Deleted

## 2011-10-12 DIAGNOSIS — I10 Essential (primary) hypertension: Secondary | ICD-10-CM

## 2011-10-12 MED ORDER — LISINOPRIL 10 MG PO TABS: 10.0000 mg | ORAL_TABLET | Freq: Every day | ORAL | Status: DC

## 2011-10-12 MED ORDER — AMLODIPINE BESYLATE 10 MG PO TABS: 10.0000 mg | ORAL_TABLET | Freq: Every day | ORAL | Status: DC

## 2011-10-12 NOTE — Telephone Encounter (Signed)
[facility] on [address] faxed over RX request for Lisinopril and Norvasc.  RX granted per Dr Constance Goltz

## 2011-10-20 ENCOUNTER — Ambulatory Visit (HOSPITAL_COMMUNITY)
Admission: RE | Admit: 2011-10-20 | Discharge: 2011-10-20 | Disposition: A | Discharge status: Home / Self Care | Payer: Medicare Other | Source: Ambulatory Visit | Attending: Internal Medicine | Admitting: Internal Medicine

## 2011-10-20 DIAGNOSIS — Z1231 Encounter for screening mammogram for malignant neoplasm of breast: Secondary | ICD-10-CM | POA: Insufficient documentation

## 2012-02-22 ENCOUNTER — Other Ambulatory Visit: Payer: Self-pay | Admitting: *Deleted

## 2012-02-22 DIAGNOSIS — I10 Essential (primary) hypertension: Secondary | ICD-10-CM

## 2012-02-22 NOTE — Telephone Encounter (Signed)
Refill request

## 2012-02-23 MED ORDER — AMLODIPINE BESYLATE 10 MG PO TABS: 10.0000 mg | ORAL_TABLET | Freq: Every day | ORAL | Status: DC

## 2012-02-23 MED ORDER — ASPIRIN 81 MG PO TABS: 81.0000 mg | ORAL_TABLET | Freq: Every day | ORAL | Status: AC

## 2012-02-24 ENCOUNTER — Other Ambulatory Visit: Payer: Self-pay | Admitting: *Deleted

## 2012-02-24 DIAGNOSIS — I10 Essential (primary) hypertension: Secondary | ICD-10-CM

## 2012-02-24 MED ORDER — LISINOPRIL 10 MG PO TABS: 10.0000 mg | ORAL_TABLET | Freq: Every day | ORAL | Status: DC

## 2012-02-24 NOTE — Telephone Encounter (Signed)
Refill request

## 2012-05-23 ENCOUNTER — Other Ambulatory Visit: Payer: Self-pay | Admitting: Internal Medicine

## 2012-05-23 NOTE — Telephone Encounter (Signed)
Refill request

## 2012-08-29 IMAGING — US US SOFT TISSUE HEAD/NECK
1 series · 13 of 25 positions shown · non-contrast
Comparison: None.

CLINICAL DATA: History of thyromegaly.

THYROID ULTRASOUND
TECHNIQUE: Ultrasound examination of the thyroid gland and adjacent
soft tissues was performed.

[Series 1: us soft tissue head/neck · 0.08mm/px · 13 of 58 slices shown]
[im 1/58]
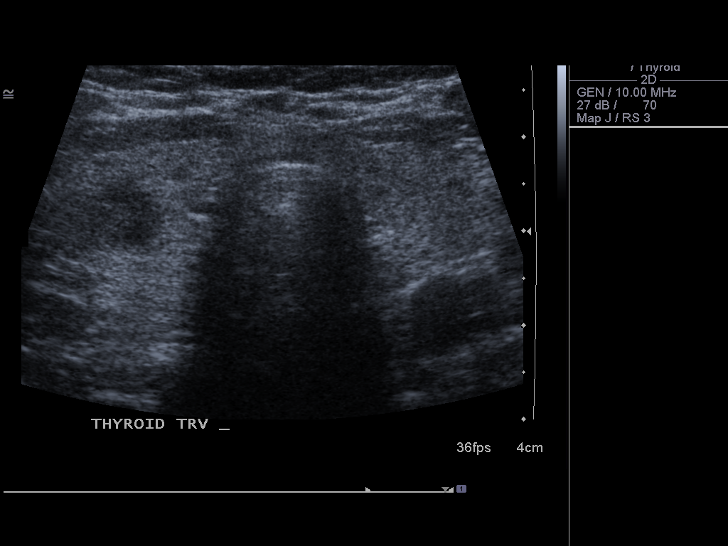
[im 5/58]
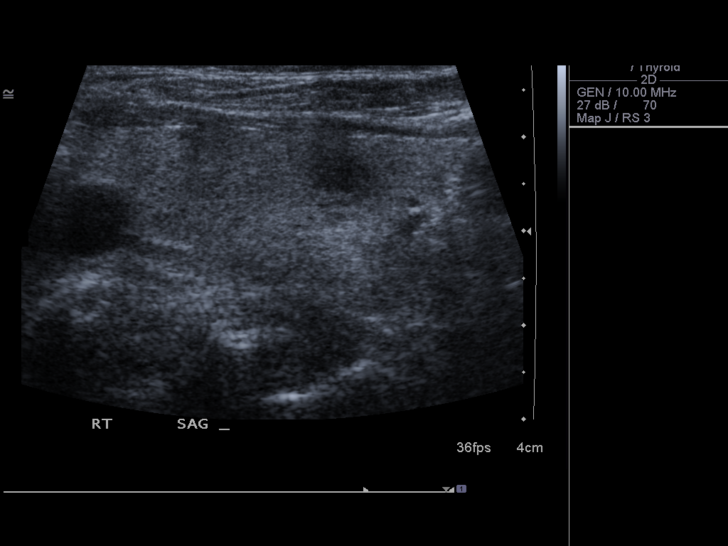
[im 10/58]
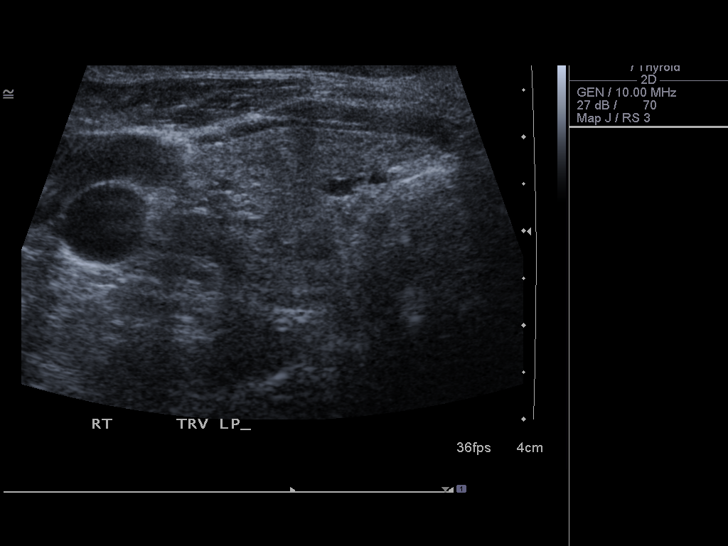
[im 15/58]
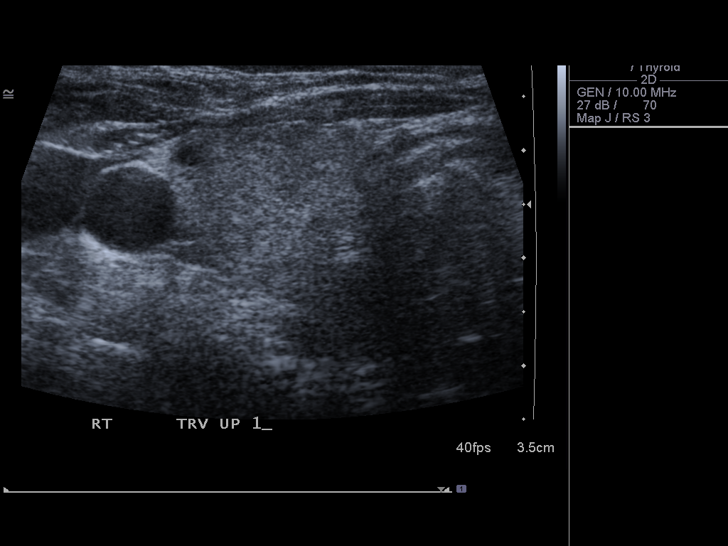
[im 20/58]
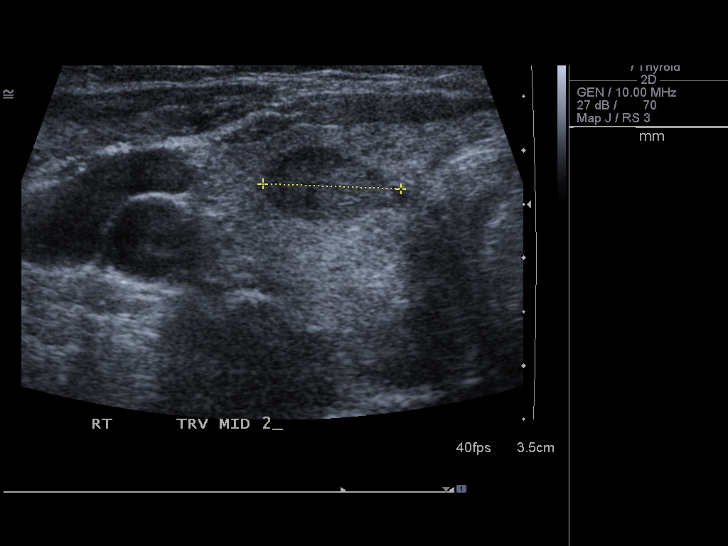
[im 24/58]
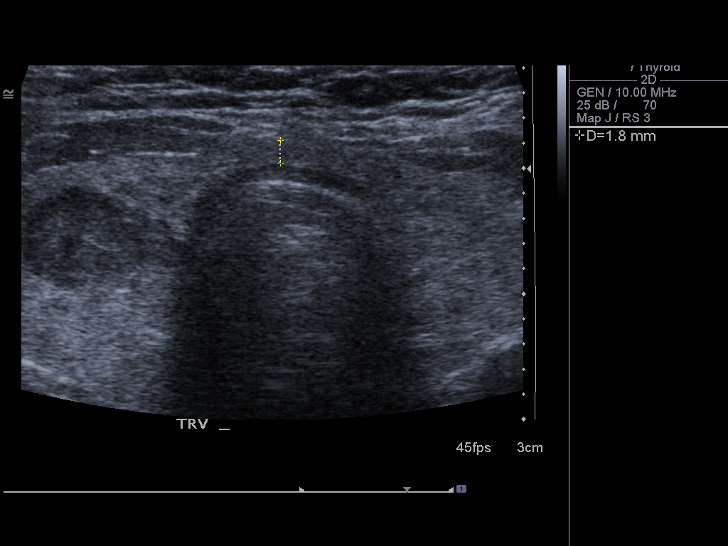
[im 29/58]
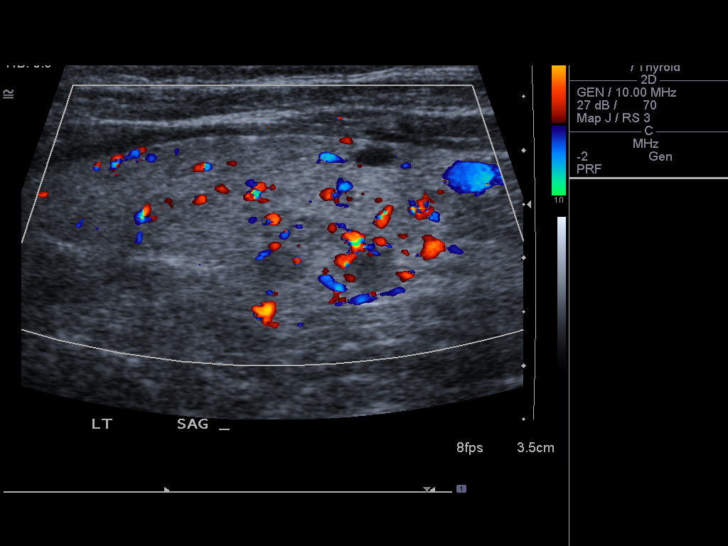
[im 34/58]
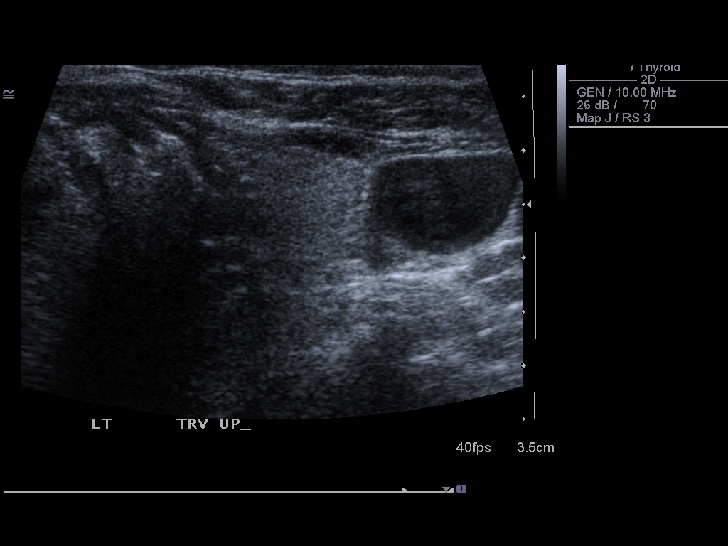
[im 39/58]
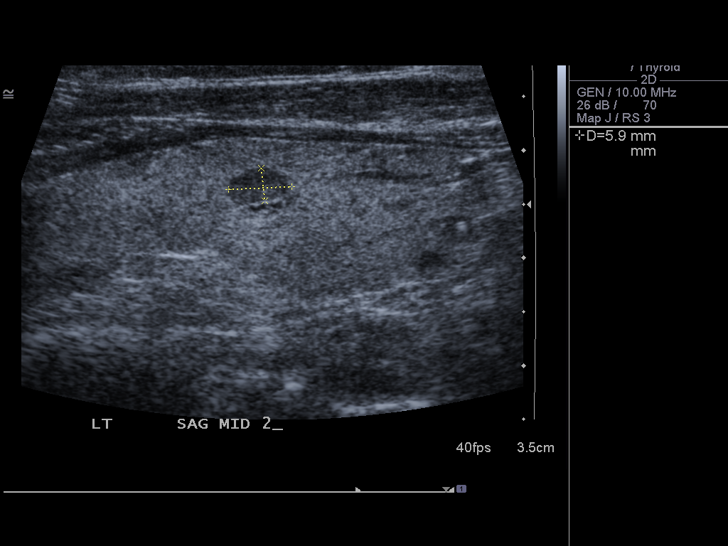
[im 43/58]
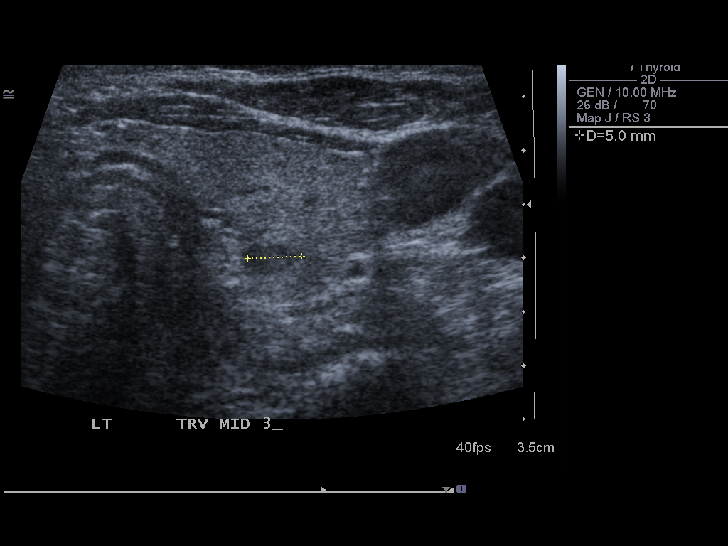
[im 48/58]
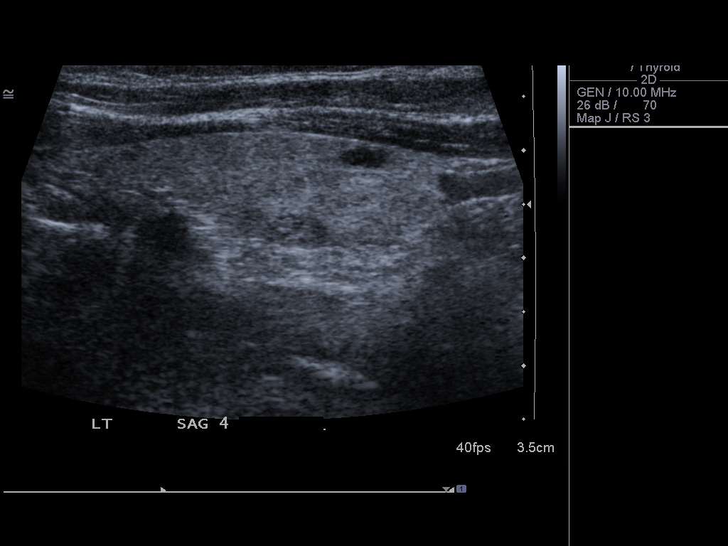
[im 53/58]
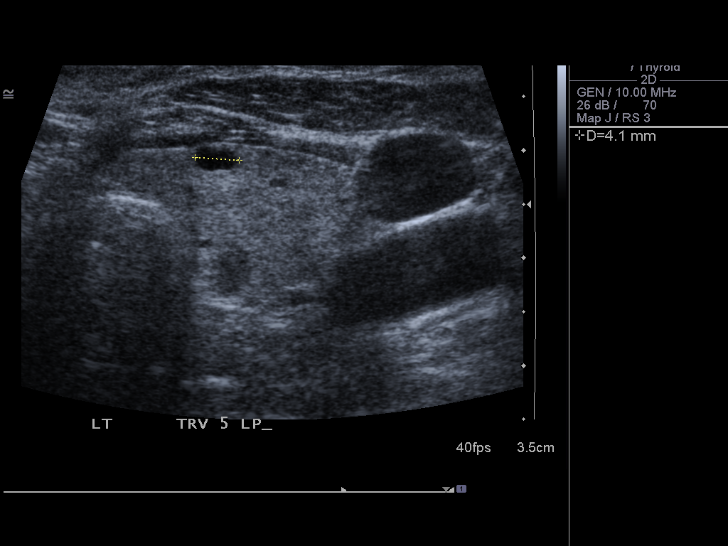
[im 58/58]
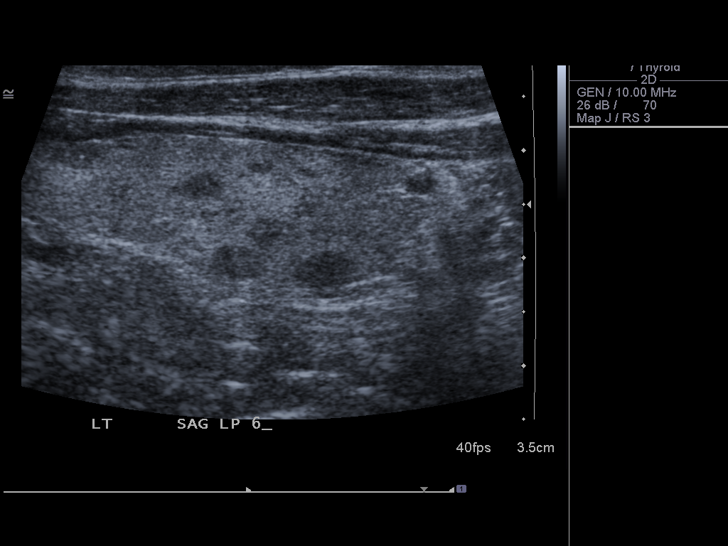

[13 of 25 positions shown; findings below may reference images not displayed]

FINDINGS: Right thyroid lobe:  The right lobe of the thyroid measures 4.6 x
2.2 x 2.2 cm with volume of 11.1 ml.

Left thyroid lobe:  The left lobe of the thyroid measures 4.4 x
x 1.5 cm with volume of 5.0 ml.

Isthmus:  AP diameter of the isthmus measures 0.2 cm.

The overall volume of the thyroid gland is 16.1 ml which is in the
range of thyromegaly.

Focal nodules:  Multiple nodular areas are seen within the thyroid
gland.

In the lateral superior portion of the right lobe of the thyroid is
a hypoechoic nodular area measuring 0.3 x 0.3 x 0.3 cm.

In the midportion of the thyroid gland is a nodular complex area
measuring 1.2 x 1.3 x 0.7 cm.

In the upper pole of the left lobe of the thyroid is a hypoechoic
nodular density measuring 0.4 x 0.402 cm.

In the upper midportion of the left lobe of the thyroid is a
hypoechoic nodular density measuring 0.6 x 0.4 x 0.3 cm.

In the midportion of the left of the thyroid is a hypoechoic
nodular area measuring 0.4 x 0.5 x 0.3 cm.

In the lower midportion of the left lobe of the thyroid is a
hypoechoic nodular area measures 0.6 x 0.6 with 0.3 cm.  In the
lower pole of the left lobe of the thyroid there are two hypoechoic
nodular areas measuring 0.6 x 0.4 x 0.2 cm and 0.6 x 0.4 x 0.3 cm
in diameter respectively.

Lymphadenopathy:  None visualized.
IMPRESSION: There is thyromegaly with the right lobe being larger than the
left.

Multiple hypodense solid-appearing nodular areas are seen involving
both lobes of the thyroid gland.

This is consistent with multinodular goiter.

The largest nodule has a greatest diameter of 1.3 centimeters.
This is less than the 1.5 cm threshold for biopsy recommendation.
Recommend follow-up ultrasound be obtained in 6 months for
additional evaluation.

This recommendation follows the consensus statement:  Management of
Thyroid Nodules Detected as US:  Society of Radiologists in
800.  Available online at :
[URL]

## 2012-09-20 ENCOUNTER — Other Ambulatory Visit: Payer: Self-pay | Admitting: Internal Medicine

## 2012-09-20 DIAGNOSIS — Z1231 Encounter for screening mammogram for malignant neoplasm of breast: Secondary | ICD-10-CM

## 2012-10-12 ENCOUNTER — Ambulatory Visit (INDEPENDENT_AMBULATORY_CARE_PROVIDER_SITE_OTHER): Payer: Medicare Other | Admitting: Internal Medicine

## 2012-10-12 ENCOUNTER — Encounter: Payer: Self-pay | Admitting: Internal Medicine

## 2012-10-12 VITALS — BP 144/86 | HR 71 | Resp 16 | Ht 61.0 in | Wt 142.0 lb

## 2012-10-12 DIAGNOSIS — L293 Anogenital pruritus, unspecified: Secondary | ICD-10-CM

## 2012-10-12 DIAGNOSIS — N898 Other specified noninflammatory disorders of vagina: Secondary | ICD-10-CM

## 2012-10-12 DIAGNOSIS — N904 Leukoplakia of vulva: Secondary | ICD-10-CM

## 2012-10-12 MED ORDER — HYDROCORTISONE 2.5 % EX CREA: TOPICAL_CREAM | Freq: Two times a day (BID) | CUTANEOUS | Status: AC

## 2012-10-12 NOTE — Patient Instructions (Addendum)
Will refer to GYN
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
## 2012-10-20 ENCOUNTER — Encounter: Payer: Self-pay | Admitting: *Deleted

## 2012-11-01 ENCOUNTER — Ambulatory Visit (HOSPITAL_COMMUNITY)
Admission: RE | Admit: 2012-11-01 | Discharge: 2012-11-01 | Disposition: A | Discharge status: Home / Self Care | Payer: Medicare Other | Source: Ambulatory Visit | Attending: Internal Medicine | Admitting: Internal Medicine

## 2012-11-01 DIAGNOSIS — Z1231 Encounter for screening mammogram for malignant neoplasm of breast: Secondary | ICD-10-CM | POA: Insufficient documentation

## 2012-11-02 ENCOUNTER — Other Ambulatory Visit: Payer: Self-pay | Admitting: Obstetrics & Gynecology

## 2012-11-04 ENCOUNTER — Other Ambulatory Visit: Payer: Self-pay | Admitting: Internal Medicine

## 2012-11-08 ENCOUNTER — Other Ambulatory Visit: Payer: Self-pay | Admitting: *Deleted

## 2012-11-08 DIAGNOSIS — I639 Cerebral infarction, unspecified: Secondary | ICD-10-CM

## 2012-11-08 DIAGNOSIS — M8580 Other specified disorders of bone density and structure, unspecified site: Secondary | ICD-10-CM

## 2012-11-08 DIAGNOSIS — I10 Essential (primary) hypertension: Secondary | ICD-10-CM

## 2012-11-08 DIAGNOSIS — E785 Hyperlipidemia, unspecified: Secondary | ICD-10-CM

## 2012-11-08 LAB — COMPREHENSIVE METABOLIC PANEL
ALT: 14 U/L (ref 0–35)
AST: 12 U/L (ref 0–37)
Alkaline Phosphatase: 75 U/L (ref 39–117)
Calcium: 9.4 mg/dL (ref 8.4–10.5)
Glucose, Bld: 93 mg/dL (ref 70–99)
Potassium: 4 mEq/L (ref 3.5–5.3)
Sodium: 140 mEq/L (ref 135–145)
Total Bilirubin: 0.5 mg/dL (ref 0.3–1.2)

## 2012-11-08 LAB — LIPID PANEL
HDL: 51 mg/dL (ref >39)
LDL Cholesterol: 127 mg/dL — ABNORMAL HIGH (ref 0–99)
VLDL: 21 mg/dL (ref 0–40)

## 2012-11-08 MED ORDER — LISINOPRIL 10 MG PO TABS: 10.0000 mg | ORAL_TABLET | Freq: Every day | ORAL | Status: DC

## 2012-11-08 NOTE — Telephone Encounter (Signed)
Pt will come by this Pm 9/2 to have labs drawn refill request

## 2012-11-08 NOTE — Telephone Encounter (Signed)
Brandy Odonnell  She has not had labs in one year.  Get all labs,  Cbc, lipids, TSh wit her chemistries  I refilled med for 3o days only

## 2012-11-09 ENCOUNTER — Encounter: Payer: Self-pay | Admitting: *Deleted

## 2012-11-09 LAB — CBC WITH DIFFERENTIAL/PLATELET
Basophils Relative: 1 % (ref 0–1)
Eosinophils Absolute: 0.3 10*3/uL (ref 0.0–0.7)
Hemoglobin: 13.6 g/dL (ref 12.0–15.0)
MCH: 29.9 pg (ref 26.0–34.0)
MCHC: 33.6 g/dL (ref 30.0–36.0)
Monocytes Relative: 8 % (ref 3–12)
Neutro Abs: 7.8 10*3/uL — ABNORMAL HIGH (ref 1.7–7.7)
Neutrophils Relative %: 65 % (ref 43–77)
Platelets: 369 10*3/uL (ref 150–400)
RBC: 4.55 MIL/uL (ref 3.87–5.11)

## 2012-11-09 LAB — TSH: TSH: 1.166 u[IU]/mL (ref 0.350–4.500)

## 2012-11-09 LAB — VITAMIN D 25 HYDROXY (VIT D DEFICIENCY, FRACTURES): Vit D, 25-Hydroxy: 38 ng/mL (ref 30–89)

## 2012-12-09 ENCOUNTER — Other Ambulatory Visit: Payer: Self-pay | Admitting: Internal Medicine

## 2012-12-12 ENCOUNTER — Other Ambulatory Visit: Payer: Self-pay | Admitting: *Deleted

## 2012-12-12 NOTE — Telephone Encounter (Signed)
Refill request labs UTD

## 2013-08-21 IMAGING — US US SOFT TISSUE HEAD/NECK
1 series · 13 of 25 positions shown · non-contrast
Comparison: 06/18/2010

CLINICAL DATA: Multiple thyroid nodules

THYROID ULTRASOUND
TECHNIQUE: Ultrasound examination of the thyroid gland and adjacent
soft tissues was performed.

[Series 1: us soft tissue head/neck · 0.08mm/px · 13 of 44 slices shown]
[im 1/44]
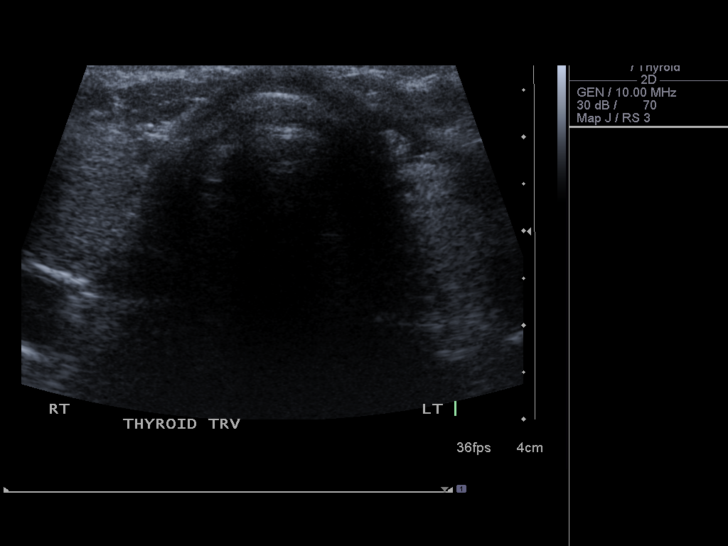
[im 4/44]
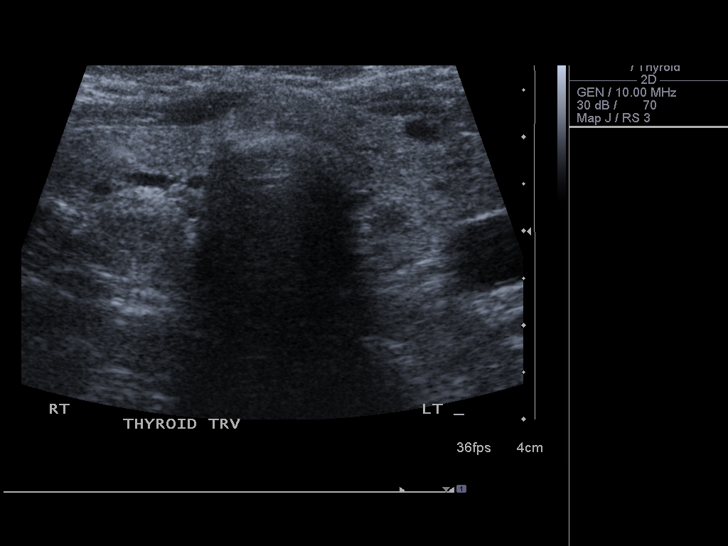
[im 8/44]
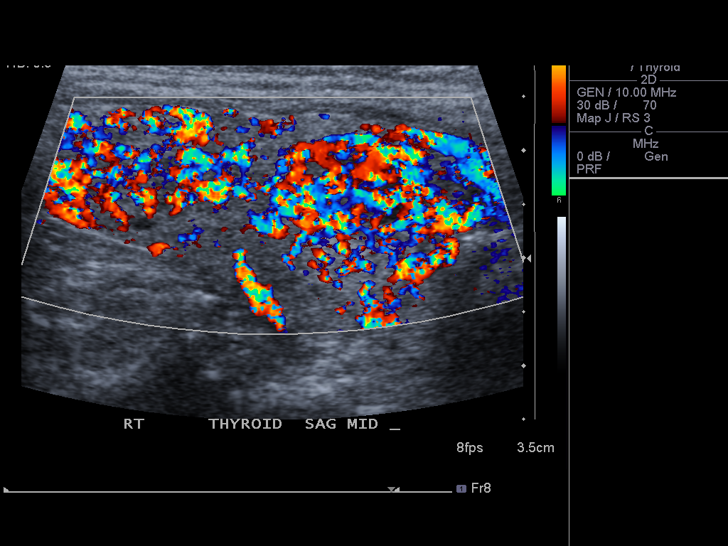
[im 11/44]
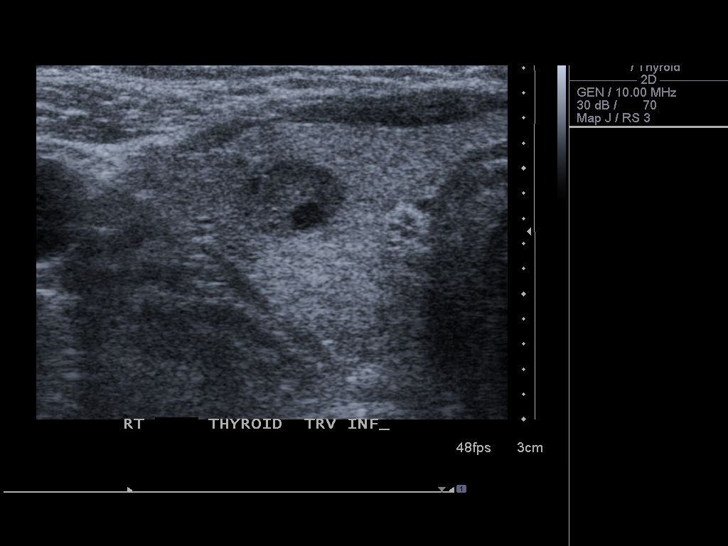
[im 15/44]
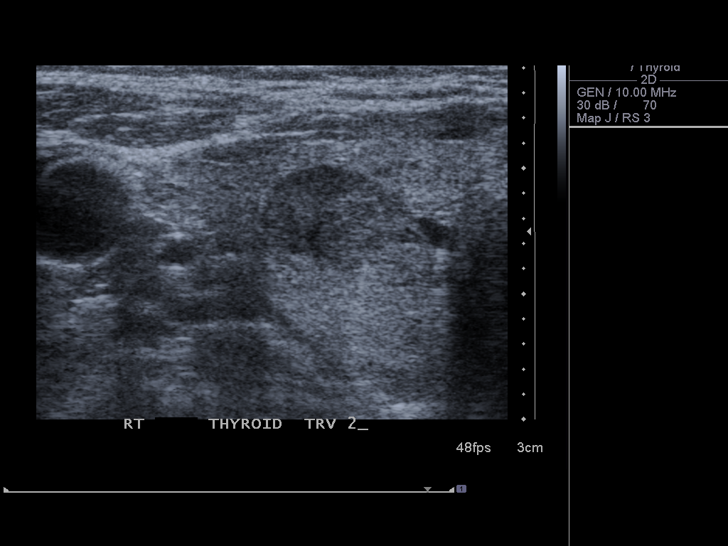
[im 18/44]
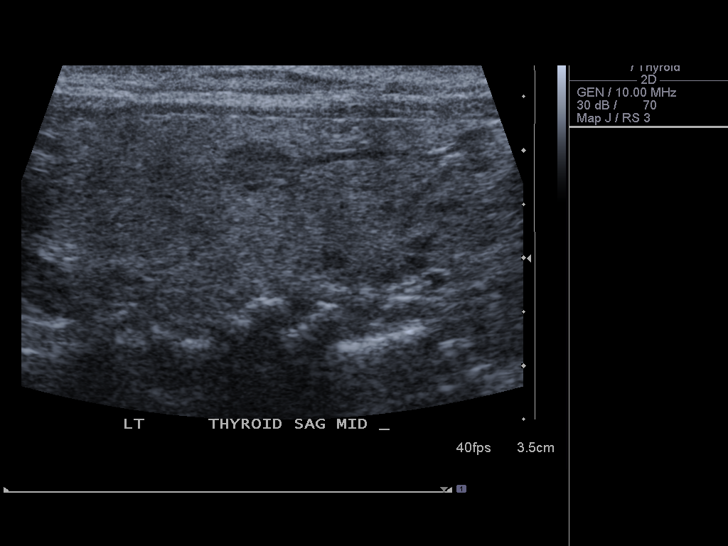
[im 22/44]
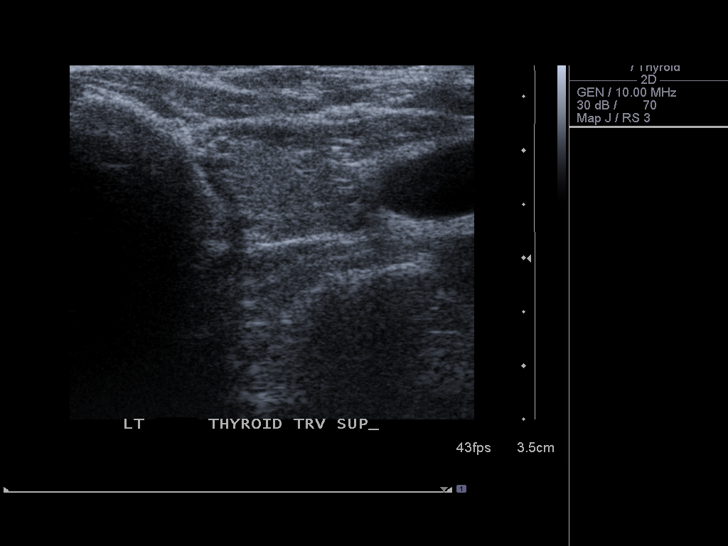
[im 26/44]
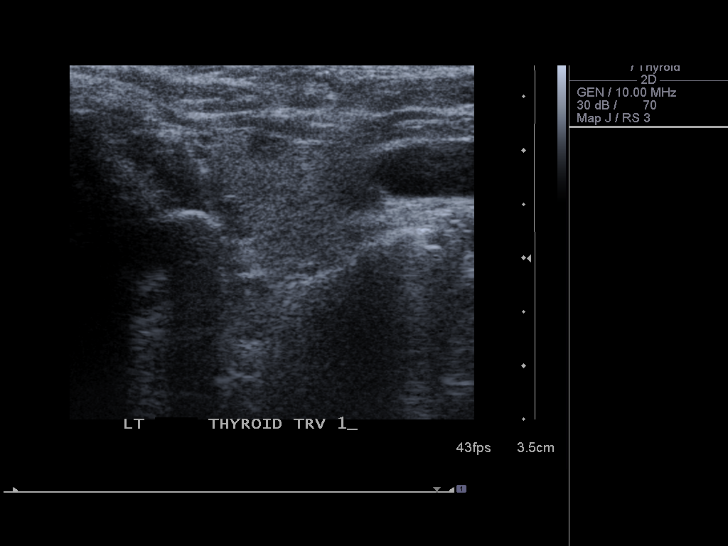
[im 29/44]
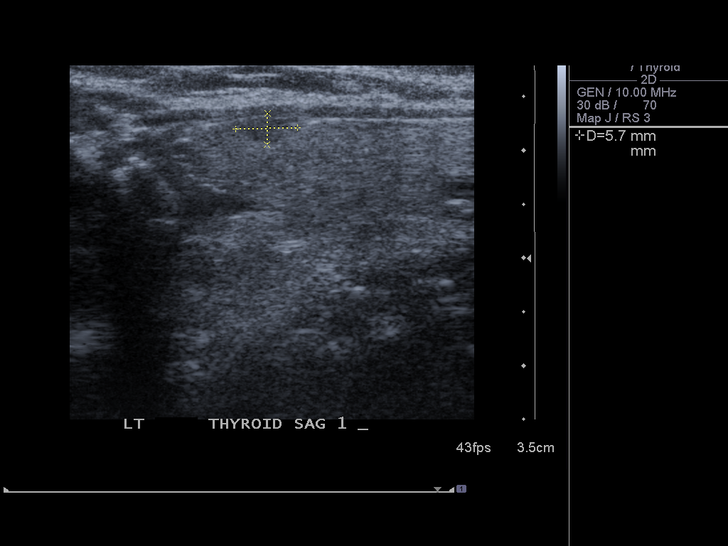
[im 33/44]
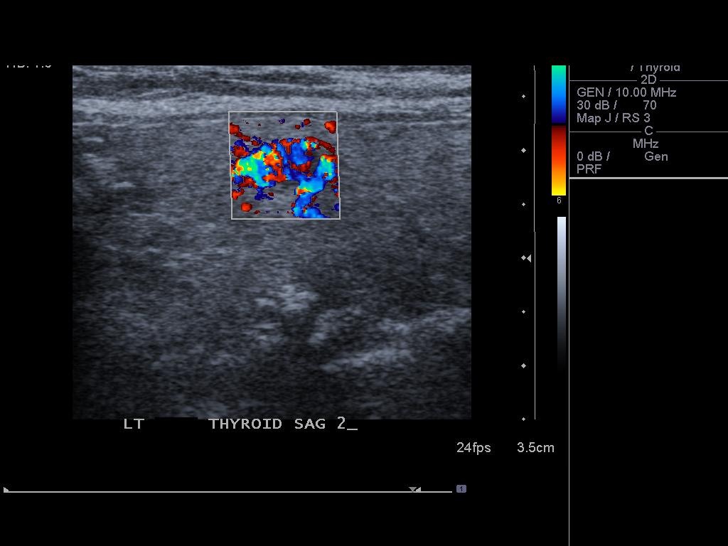
[im 36/44]
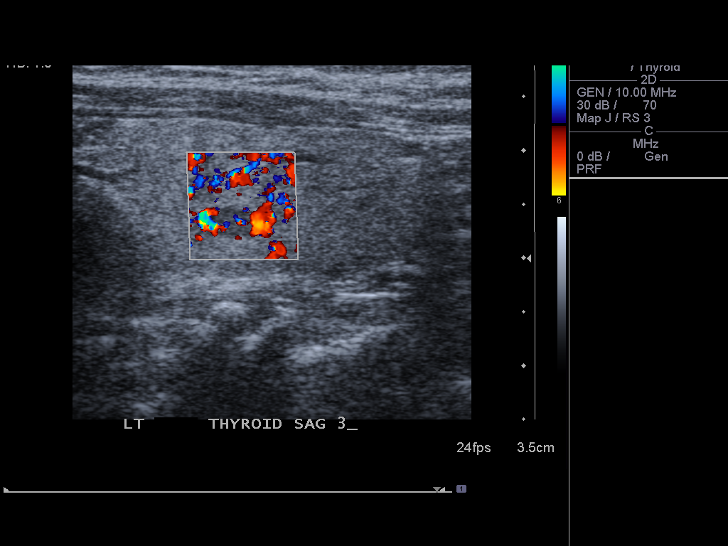
[im 40/44]
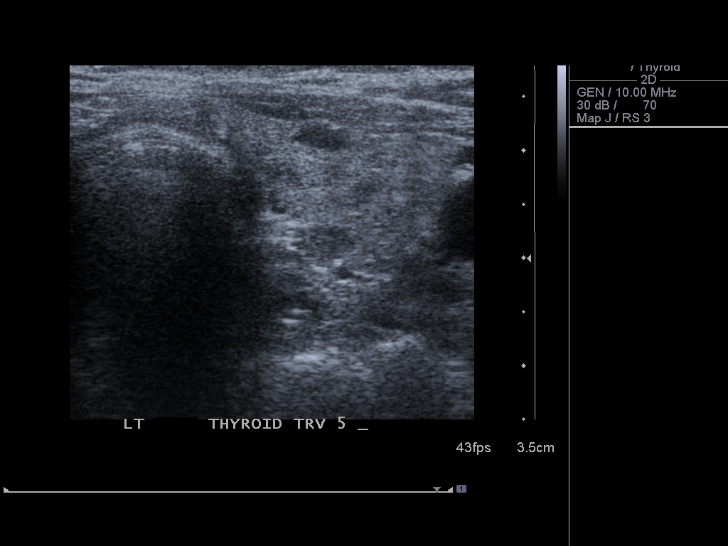
[im 44/44]
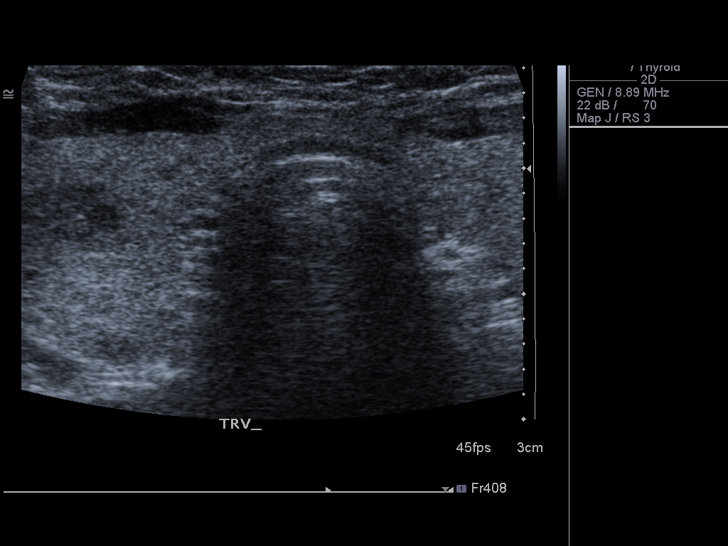

[13 of 25 positions shown; findings below may reference images not displayed]

FINDINGS: Right thyroid lobe:  21 x 22 x 40 mm, inhomogeneous
Left thyroid lobe:  17 x 21 x 42 mm
Isthmus:  2.7 mm in thickness

Focal nodules:  4 x 6 x 6 mm hypoechoic solid, superior right
x 13)
4 x 5 x 7 mm hypoechoic solid, superior left
4 x 6 x 6 mm hypoechoic solid, medial left
3 x 4 x 6 mm hypoechoic, lateral left
5 x 7 x 8 mm hypoechoic solid, deep inferior left
There are additional smaller left-sided nodules measuring all less
than 6 mm maximal diameter.

Lymphadenopathy:  None visualized.
IMPRESSION: 1. Thyromegaly with small bilateral thyroid nodules as above.
Findings do not meet current consensus criteria for biopsy.  Follow-
up by clinical exam is recommended.  If patient has known risk
factors for thyroid carcinoma, consider follow-up ultrasound in 12
months.  If patient is clinically hyperthyroid, consider nuclear
medicine thyroid uptake and scan. This recommendation follows the
consensus statement:  Management of Thyroid Nodules Detected as US:
Society of Radiologists in Ultrasound Consensus Conference

## 2013-10-04 ENCOUNTER — Other Ambulatory Visit (HOSPITAL_COMMUNITY): Payer: Self-pay | Admitting: Family Medicine

## 2013-10-04 DIAGNOSIS — Z1231 Encounter for screening mammogram for malignant neoplasm of breast: Secondary | ICD-10-CM

## 2013-11-02 ENCOUNTER — Ambulatory Visit (HOSPITAL_COMMUNITY)
Admission: RE | Admit: 2013-11-02 | Discharge: 2013-11-02 | Disposition: A | Discharge status: Home / Self Care | Payer: Medicare Other | Source: Ambulatory Visit | Attending: Family Medicine | Admitting: Family Medicine

## 2013-11-02 DIAGNOSIS — Z1231 Encounter for screening mammogram for malignant neoplasm of breast: Secondary | ICD-10-CM | POA: Diagnosis not present

## 2013-12-25 ENCOUNTER — Ambulatory Visit: Payer: Medicare Other | Attending: Family | Admitting: Physical Therapy

## 2013-12-25 DIAGNOSIS — W19XXXD Unspecified fall, subsequent encounter: Secondary | ICD-10-CM | POA: Insufficient documentation

## 2013-12-25 DIAGNOSIS — R5381 Other malaise: Secondary | ICD-10-CM | POA: Insufficient documentation

## 2013-12-25 DIAGNOSIS — Z9181 History of falling: Secondary | ICD-10-CM | POA: Insufficient documentation

## 2013-12-25 DIAGNOSIS — R262 Difficulty in walking, not elsewhere classified: Secondary | ICD-10-CM | POA: Insufficient documentation

## 2013-12-28 ENCOUNTER — Ambulatory Visit: Payer: Medicare Other | Admitting: Physical Therapy

## 2013-12-28 DIAGNOSIS — R262 Difficulty in walking, not elsewhere classified: Secondary | ICD-10-CM | POA: Diagnosis not present

## 2014-01-02 ENCOUNTER — Ambulatory Visit: Payer: Medicare Other | Admitting: Physical Therapy

## 2014-01-02 DIAGNOSIS — R262 Difficulty in walking, not elsewhere classified: Secondary | ICD-10-CM | POA: Diagnosis not present

## 2014-01-03 ENCOUNTER — Ambulatory Visit: Payer: Medicare Other | Admitting: Physical Therapy

## 2014-01-08 ENCOUNTER — Encounter: Payer: Self-pay | Admitting: Internal Medicine

## 2014-06-26 ENCOUNTER — Other Ambulatory Visit: Payer: Self-pay

## 2014-06-26 ENCOUNTER — Telehealth: Payer: Self-pay | Admitting: *Deleted

## 2014-06-26 NOTE — Telephone Encounter (Signed)
Pt called in wanting all medical records printed for her to pick up. I advised there is a $12.00 charge for her to pick up. She understands this and wants to pick them up today. Medical records will be at front desk for pt to pick up but pt needs to sign the release form before obtaining records. Release form needs to be scanned in system.

## 2014-06-26 NOTE — Progress Notes (Signed)
Pt to pick up all medical records

## 2014-10-16 ENCOUNTER — Other Ambulatory Visit (HOSPITAL_COMMUNITY): Payer: Self-pay | Admitting: Family Medicine

## 2014-10-16 DIAGNOSIS — Z1231 Encounter for screening mammogram for malignant neoplasm of breast: Secondary | ICD-10-CM

## 2014-11-05 ENCOUNTER — Ambulatory Visit (HOSPITAL_COMMUNITY)
Admission: RE | Admit: 2014-11-05 | Discharge: 2014-11-05 | Disposition: A | Discharge status: Home / Self Care | Payer: Medicare Other | Source: Ambulatory Visit | Attending: Family Medicine | Admitting: Family Medicine

## 2014-11-05 DIAGNOSIS — Z1231 Encounter for screening mammogram for malignant neoplasm of breast: Secondary | ICD-10-CM | POA: Insufficient documentation

## 2015-09-27 ENCOUNTER — Other Ambulatory Visit: Payer: Self-pay | Admitting: Family Medicine

## 2015-09-27 DIAGNOSIS — Z139 Encounter for screening, unspecified: Secondary | ICD-10-CM

## 2015-11-07 ENCOUNTER — Ambulatory Visit
Admission: RE | Admit: 2015-11-07 | Discharge: 2015-11-07 | Disposition: A | Discharge status: Home / Self Care | Payer: Medicare Other | Source: Ambulatory Visit | Attending: Family Medicine | Admitting: Family Medicine

## 2015-11-07 DIAGNOSIS — Z139 Encounter for screening, unspecified: Secondary | ICD-10-CM

## 2016-09-28 ENCOUNTER — Other Ambulatory Visit: Payer: Self-pay | Admitting: Family Medicine

## 2016-09-28 DIAGNOSIS — Z1231 Encounter for screening mammogram for malignant neoplasm of breast: Secondary | ICD-10-CM

## 2016-11-10 ENCOUNTER — Ambulatory Visit: Payer: Medicare Other

## 2017-12-07 DEATH — deceased
